# Patient Record
Sex: Male | Born: 1960 | Race: White | Hispanic: No | Marital: Married | Smoking: Never smoker
Health system: Southern US, Community
[De-identification: ages and names within clinical notes are randomized; demographics above are authoritative.]

## PROBLEM LIST (undated history)

## (undated) DIAGNOSIS — I10 Essential (primary) hypertension: Secondary | ICD-10-CM

## (undated) DIAGNOSIS — R42 Dizziness and giddiness: Secondary | ICD-10-CM

## (undated) DIAGNOSIS — Z8042 Family history of malignant neoplasm of prostate: Secondary | ICD-10-CM

## (undated) HISTORY — DX: Family history of malignant neoplasm of prostate: Z80.42

## (undated) HISTORY — DX: Dizziness and giddiness: R42

## (undated) HISTORY — PX: BIOPSY PROSTATE: PRO28

---

## 1996-09-01 HISTORY — PX: HERNIA REPAIR: SHX51

## 2019-05-11 ENCOUNTER — Encounter (HOSPITAL_COMMUNITY): Payer: Self-pay

## 2019-05-11 ENCOUNTER — Other Ambulatory Visit: Payer: Self-pay

## 2019-05-11 ENCOUNTER — Emergency Department (HOSPITAL_COMMUNITY)
Admission: EM | Admit: 2019-05-11 | Discharge: 2019-05-11 | Disposition: A | Discharge status: Home / Self Care | Payer: BLUE CROSS/BLUE SHIELD | Attending: Emergency Medicine | Admitting: Emergency Medicine

## 2019-05-11 DIAGNOSIS — H6123 Impacted cerumen, bilateral: Secondary | ICD-10-CM | POA: Insufficient documentation

## 2019-05-11 DIAGNOSIS — I10 Essential (primary) hypertension: Secondary | ICD-10-CM | POA: Diagnosis not present

## 2019-05-11 DIAGNOSIS — H81391 Other peripheral vertigo, right ear: Secondary | ICD-10-CM | POA: Diagnosis not present

## 2019-05-11 DIAGNOSIS — R42 Dizziness and giddiness: Secondary | ICD-10-CM | POA: Diagnosis not present

## 2019-05-11 HISTORY — DX: Essential (primary) hypertension: I10

## 2019-05-11 LAB — COMPREHENSIVE METABOLIC PANEL
ALT: 22 U/L (ref 0–44)
AST: 19 U/L (ref 15–41)
Albumin: 4.5 g/dL (ref 3.5–5.0)
Alkaline Phosphatase: 60 U/L (ref 38–126)
Anion gap: 8 (ref 5–15)
BUN: 15 mg/dL (ref 6–20)
CO2: 28 mmol/L (ref 22–32)
Calcium: 9.1 mg/dL (ref 8.9–10.3)
Chloride: 104 mmol/L (ref 98–111)
Creatinine, Ser: 1.02 mg/dL (ref 0.61–1.24)
GFR calc Af Amer: >60 mL/min (ref >60)
GFR calc non Af Amer: >60 mL/min (ref >60)
Glucose, Bld: 116 mg/dL — ABNORMAL HIGH (ref 70–99)
Potassium: 3.8 mmol/L (ref 3.5–5.1)
Sodium: 140 mmol/L (ref 135–145)
Total Bilirubin: 1 mg/dL (ref 0.3–1.2)
Total Protein: 7.4 g/dL (ref 6.5–8.1)

## 2019-05-11 LAB — CBC WITH DIFFERENTIAL/PLATELET
Abs Immature Granulocytes: 0.08 10*3/uL — ABNORMAL HIGH (ref 0.00–0.07)
Basophils Absolute: 0 10*3/uL (ref 0.0–0.1)
Basophils Relative: 1 %
Eosinophils Absolute: 0 10*3/uL (ref 0.0–0.5)
Eosinophils Relative: 0 %
HCT: 49.1 % (ref 39.0–52.0)
Hemoglobin: 16.4 g/dL (ref 13.0–17.0)
Immature Granulocytes: 1 %
Lymphocytes Relative: 12 %
Lymphs Abs: 1 10*3/uL (ref 0.7–4.0)
MCH: 30.8 pg (ref 26.0–34.0)
MCHC: 33.4 g/dL (ref 30.0–36.0)
MCV: 92.1 fL (ref 80.0–100.0)
Monocytes Absolute: 0.4 10*3/uL (ref 0.1–1.0)
Monocytes Relative: 4 %
Neutro Abs: 7 10*3/uL (ref 1.7–7.7)
Neutrophils Relative %: 82 %
Platelets: 194 10*3/uL (ref 150–400)
RBC: 5.33 MIL/uL (ref 4.22–5.81)
RDW: 13.2 % (ref 11.5–15.5)
WBC: 8.5 10*3/uL (ref 4.0–10.5)
nRBC: 0 % (ref 0.0–0.2)

## 2019-05-11 MED ORDER — DOCUSATE SODIUM 50 MG/5ML PO LIQD
25.0000 mg | Freq: Once | ORAL | Status: AC
  Administered 2019-05-11: 14:00:00 25 mg via OTIC
  Filled 2019-05-11: qty 10

## 2019-05-11 MED ORDER — MECLIZINE HCL 12.5 MG PO TABS
25.0000 mg | ORAL_TABLET | Freq: Once | ORAL | Status: AC
  Administered 2019-05-11: 25 mg via ORAL
  Filled 2019-05-11: qty 2

## 2019-05-11 MED ORDER — MECLIZINE HCL 25 MG PO TABS: 25.0000 mg | ORAL_TABLET | Freq: Three times a day (TID) | ORAL | 0 refills | Status: DC | PRN

## 2019-05-11 MED ORDER — SODIUM CHLORIDE 0.9 % IV BOLUS
1000.0000 mL | Freq: Once | INTRAVENOUS | Status: AC
  Administered 2019-05-11: 1000 mL via INTRAVENOUS

## 2019-05-11 NOTE — ED Notes (Signed)
Bilateral ears irrigated with warm water per Dr. Laverta Baltimore. Return of minimal particles with irrigation. Pt reports he can hear better out of the leg ear now, but no real change in right ear.

## 2019-05-11 NOTE — ED Provider Notes (Signed)
Emergency Department Provider Note   I have reviewed the triage vital signs and the nursing notes.   HISTORY  Chief Complaint Dizziness   HPI Patrick Lambert is a 58 y.o. male with PMH of HTN presents to the emergency department for evaluation of acute onset vertigo symptoms with decreased hearing in the right ear.  Symptoms began at 21:00 yesterday.  Patient states that he frequently has issues with earwax buildup but has never had symptoms like this in the past.  He describes feeling of room spinning with severe nausea and some vomiting at times.  Still and made worse with movements.  Patient had vomiting through the night.  His vertigo symptoms have improved to some degree this morning with symptoms mainly when moving his head back to midline after looking right. No CP or SOB. No unilateral weakness/numbness. No blurry or double vision. No sudden vision loss. Patient reports chronic tinnitus which is not worse from his baseline. No otalgia.   Past Medical History:  Diagnosis Date  . Hypertension     There are no active problems to display for this patient.   Past Surgical History:  Procedure Laterality Date  . HERNIA REPAIR  1998    Allergies Hornet venom, Shellfish allergy, Levaquin [levofloxacin], and Penicillins  No family history on file.  Social History Social History   Tobacco Use  . Smoking status: Never Smoker  . Smokeless tobacco: Never Used  Substance Use Topics  . Alcohol use: Never    Frequency: Never  . Drug use: Never    Review of Systems  Constitutional: No fever/chills Eyes: No visual changes. ENT: No sore throat. Decreased hearing on the right. Positive vertigo.  Cardiovascular: Denies chest pain. Respiratory: Denies shortness of breath. Gastrointestinal: No abdominal pain. Positive nausea and vomiting.  No diarrhea.  No constipation. Genitourinary: Negative for dysuria. Musculoskeletal: Negative for back pain. Skin: Negative for rash.  Neurological: Negative for headaches, focal weakness or numbness.  10-point ROS otherwise negative.  ____________________________________________   PHYSICAL EXAM:  VITAL SIGNS: ED Triage Vitals  Enc Vitals Group     BP 05/11/19 1208 (!) 184/118     Pulse Rate 05/11/19 1208 73     Resp 05/11/19 1208 18     Temp 05/11/19 1208 97.7 F (36.5 C)     Temp Source 05/11/19 1208 Oral     SpO2 05/11/19 1208 100 %     Weight 05/11/19 1207 206 lb (93.4 kg)     Height 05/11/19 1207 5\' 9"  (1.753 m)   Constitutional: Alert and oriented. Well appearing and in no acute distress. Eyes: Conjunctivae are normal. PERRL. EOMI. Slight horizontal nystagmus when looking right.  Head: Atraumatic. Nose: No congestion/rhinnorhea. Mouth/Throat: Mucous membranes are moist.  Oropharynx non-erythematous. Ears: Bilateral cerumen impaction noted. Otherwise normal external canals.  Neck: No stridor.  Cardiovascular: Normal rate, regular rhythm. Good peripheral circulation. Grossly normal heart sounds.   Respiratory: Normal respiratory effort.  No retractions. Lungs CTAB. Gastrointestinal: Soft and nontender. No distention.  Musculoskeletal: No lower extremity tenderness nor edema. No gross deformities of extremities. Neurologic:  Normal speech and language. No CN deficits 2-12. Normal upper and lower extremity strength/sensation. Normal finger-to-nose and heel-to-shin testing.  Skin:  Skin is warm, dry and intact. No rash noted.   ____________________________________________   LABS (all labs ordered are listed, but only abnormal results are displayed)  Labs Reviewed  COMPREHENSIVE METABOLIC PANEL - Abnormal; Notable for the following components:      Result Value  Glucose, Bld 116 (*)    All other components within normal limits  CBC WITH DIFFERENTIAL/PLATELET - Abnormal; Notable for the following components:   Abs Immature Granulocytes 0.08 (*)    All other components within normal limits     ____________________________________________   PROCEDURES  Procedure(s) performed:   Procedures  None ____________________________________________   INITIAL IMPRESSION / ASSESSMENT AND PLAN / ED COURSE  Pertinent labs & imaging results that were available during my care of the patient were reviewed by me and considered in my medical decision making (see chart for details).   Patient presents to the emergency department for evaluation of symptoms most consistent with peripheral vertigo.  He has bilateral cerumen impaction which does appear somewhat worse on the right.  He has no focal neurologic deficits.  Symptoms are worse with movement.  Plan for treatment of the cerumen impaction, screening blood work, IV fluids, meclizine. Very low suspicion clinically for central process.   Labs unremarkable. Irrigated ears after colace with mild improvement. Dizziness improved. Will refer to ENT for further evaluation and ear irrigation.  ____________________________________________  FINAL CLINICAL IMPRESSION(S) / ED DIAGNOSES  Final diagnoses:  Peripheral vertigo involving right ear  Bilateral impacted cerumen     MEDICATIONS GIVEN DURING THIS VISIT:  Medications  sodium chloride 0.9 % bolus 1,000 mL (0 mLs Intravenous Stopped 05/11/19 1415)  meclizine (ANTIVERT) tablet 25 mg (25 mg Oral Given 05/11/19 1335)  docusate (COLACE) 50 MG/5ML liquid 25 mg (25 mg Right EAR Given 05/11/19 1428)     NEW OUTPATIENT MEDICATIONS STARTED DURING THIS VISIT:  Discharge Medication List as of 05/11/2019  2:51 PM    START taking these medications   Details  meclizine (ANTIVERT) 25 MG tablet Take 1 tablet (25 mg total) by mouth 3 (three) times daily as needed for dizziness or nausea., Starting Wed 05/11/2019, Normal        Note:  This document was prepared using Dragon voice recognition software and may include unintentional dictation errors.  Alona BeneJoshua , MD Emergency Medicine    , Arlyss RepressJoshua G,  MD 05/11/19 [id number]

## 2019-05-11 NOTE — ED Triage Notes (Signed)
Pt started feeling dizzy at 2100. Was walking around and got worse. Dizziness worse with movement. If pt is focused and eyes are closed he denies any dizziness.

## 2019-05-11 NOTE — Discharge Instructions (Signed)
You were seen in the emergency department today with dizziness and vomiting.  Your test in the emergency department or largely normal.  I suspect that this is from a problem with your inner ear.  I have prescribed medication which should improve your symptoms.  We have irrigated your ears to try and remove the earwax buildup which may be contributing to your dizziness.  Please call the ENT doctor listed to schedule a follow-up appointment for further evaluation and potential rehab exercises.  You should return to the emergency department immediately if your symptoms suddenly worsen or if new symptoms develop such as vision change, numbness/weakness, or difficulty with speech/swallowing.

## 2019-05-12 ENCOUNTER — Telehealth: Payer: Self-pay | Admitting: Family Medicine

## 2019-05-12 NOTE — Telephone Encounter (Signed)
ER followup appointment scheduled 05/18/2019 with Patrick Lambert.

## 2019-05-17 ENCOUNTER — Other Ambulatory Visit: Payer: Self-pay

## 2019-05-18 ENCOUNTER — Ambulatory Visit: Payer: BLUE CROSS/BLUE SHIELD | Admitting: Family Medicine

## 2019-05-18 ENCOUNTER — Encounter: Payer: Self-pay | Admitting: Family Medicine

## 2019-05-18 VITALS — BP 164/96 | HR 67 | Temp 98.4°F | Resp 20 | Ht 69.0 in | Wt 215.4 lb

## 2019-05-18 DIAGNOSIS — R42 Dizziness and giddiness: Secondary | ICD-10-CM | POA: Diagnosis not present

## 2019-05-18 DIAGNOSIS — Z8042 Family history of malignant neoplasm of prostate: Secondary | ICD-10-CM | POA: Diagnosis not present

## 2019-05-18 DIAGNOSIS — I10 Essential (primary) hypertension: Secondary | ICD-10-CM

## 2019-05-18 DIAGNOSIS — H6123 Impacted cerumen, bilateral: Secondary | ICD-10-CM | POA: Diagnosis not present

## 2019-05-18 MED ORDER — METOPROLOL SUCCINATE ER 50 MG PO TB24: 50.0000 mg | ORAL_TABLET | Freq: Every day | ORAL | 0 refills | Status: DC

## 2019-05-18 MED ORDER — MECLIZINE HCL 25 MG PO TABS: 25.0000 mg | ORAL_TABLET | Freq: Three times a day (TID) | ORAL | 0 refills | Status: DC | PRN

## 2019-05-18 NOTE — Patient Instructions (Signed)
Use Debrox Wax Removal Kit over the counter.   Earwax Buildup, Adult The ears produce a substance called earwax that helps keep bacteria out of the ear and protects the skin in the ear canal. Occasionally, earwax can build up in the ear and cause discomfort or hearing loss. What increases the risk? This condition is more likely to develop in people who:  Are male.  Are elderly.  Naturally produce more earwax.  Clean their ears often with cotton swabs.  Use earplugs often.  Use in-ear headphones often.  Wear hearing aids.  Have narrow ear canals.  Have earwax that is overly thick or sticky.  Have eczema.  Are dehydrated.  Have excess hair in the ear canal. What are the signs or symptoms? Symptoms of this condition include:  Reduced or muffled hearing.  A feeling of fullness in the ear or feeling that the ear is plugged.  Fluid coming from the ear.  Ear pain.  Ear itch.  Ringing in the ear.  Coughing.  An obvious piece of earwax that can be seen inside the ear canal. How is this diagnosed? This condition may be diagnosed based on:  Your symptoms.  Your medical history.  An ear exam. During the exam, your health care provider will look into your ear with an instrument called an otoscope. You may have tests, including a hearing test. How is this treated? This condition may be treated by:  Using ear drops to soften the earwax.  Having the earwax removed by a health care provider. The health care provider may: ? Flush the ear with water. ? Use an instrument that has a loop on the end (curette). ? Use a suction device.  Surgery to remove the wax buildup. This may be done in severe cases. Follow these instructions at home:   Take over-the-counter and prescription medicines only as told by your health care provider.  Do not put any objects, including cotton swabs, into your ear. You can clean the opening of your ear canal with a washcloth or facial  tissue.  Follow instructions from your health care provider about cleaning your ears. Do not over-clean your ears.  Drink enough fluid to keep your urine clear or pale yellow. This will help to thin the earwax.  Keep all follow-up visits as told by your health care provider. If earwax builds up in your ears often or if you use hearing aids, consider seeing your health care provider for routine, preventive ear cleanings. Ask your health care provider how often you should schedule your cleanings.  If you have hearing aids, clean them according to instructions from the manufacturer and your health care provider. Contact a health care provider if:  You have ear pain.  You develop a fever.  You have blood, pus, or other fluid coming from your ear.  You have hearing loss.  You have ringing in your ears that does not go away.  Your symptoms do not improve with treatment.  You feel like the room is spinning (vertigo). Summary  Earwax can build up in the ear and cause discomfort or hearing loss.  The most common symptoms of this condition include reduced or muffled hearing and a feeling of fullness in the ear or feeling that the ear is plugged.  This condition may be diagnosed based on your symptoms, your medical history, and an ear exam.  This condition may be treated by using ear drops to soften the earwax or by having the earwax removed  by a health care provider.  Do not put any objects, including cotton swabs, into your ear. You can clean the opening of your ear canal with a washcloth or facial tissue. This information is not intended to replace advice given to you by your health care provider. Make sure you discuss any questions you have with your health care provider. Document Released: 09/25/2004 Document Revised: 07/31/2017 Document Reviewed: 10/29/2016 Elsevier Patient Education  [place].   Vertigo Vertigo is the feeling that you or the things around you are moving  when they are not. This feeling can come and go at any time. Vertigo often goes away on its own. This condition can be dangerous if it happens when you are doing activities like driving or working with machines. Your doctor will do tests to find the cause of your vertigo. These tests will also help your doctor decide on the best treatment for you. Follow these instructions at home: Eating and drinking      Drink enough fluid to keep your pee (urine) pale yellow.  Do not drink alcohol. Activity  Return to your normal activities as told by your doctor. Ask your doctor what activities are safe for you.  In the morning, first sit up on the side of the bed. When you feel okay, stand slowly while you hold onto something until you know that your balance is fine.  Move slowly. Avoid sudden body or head movements or certain positions, as told by your doctor.  Use a cane if you have trouble standing or walking.  Sit down right away if you feel dizzy.  Avoid doing any tasks or activities that can cause danger to you or others if you get dizzy.  Avoid bending down if you feel dizzy. Place items in your home so that they are easy for you to reach without leaning over.  Do not drive or use heavy machinery if you feel dizzy. General instructions  Take over-the-counter and prescription medicines only as told by your doctor.  Keep all follow-up visits as told by your doctor. This is important. Contact a doctor if:  Your medicine does not help your vertigo.  You have a fever.  Your problems get worse or you have new symptoms.  Your family or friends see changes in your behavior.  The feeling of being sick to your stomach gets worse.  Your vomiting gets worse.  You lose feeling (have numbness) in part of your body.  You feel prickling and tingling in a part of your body. Get help right away if:  You have trouble moving or talking.  You are always dizzy.  You pass out (faint).   You get very bad headaches.  You feel weak in your hands, arms, or legs.  You have changes in your hearing.  You have changes in how you see (vision).  You get a stiff neck.  Bright light starts to bother you. Summary  Vertigo is the feeling that you or the things around you are moving when they are not.  Your doctor will do tests to find the cause of your vertigo.  You may be told to avoid some tasks, positions, or movements.  Contact a doctor if your medicine is not helping, or if you have a fever, new symptoms, or a change in behavior.  Get help right away if you get very bad headaches, or if you have changes in how you speak, hear, or see. This information is not intended to  replace advice given to you by your health care provider. Make sure you discuss any questions you have with your health care provider. Document Released: 05/27/2008 Document Revised: 07/12/2018 Document Reviewed: 07/12/2018 Elsevier Patient Education  2020 [employer].

## 2019-05-18 NOTE — Progress Notes (Signed)
Assessment & Plan:  1. Vertigo - Resolved at this time. Patient would like referral to ENT. Education provided on vertigo.  - Ambulatory referral to ENT - meclizine (ANTIVERT) 25 MG tablet; Take 1 tablet (25 mg total) by mouth 3 (three) times daily as needed for dizziness or nausea.  Dispense: 30 tablet; Refill: 0  2. Excessive cerumen in ear canal, bilateral - Encouraged to use Debrox Wax Removal Kit OTC. Education provided on excessive ear wax.   3. Essential hypertension - Elevated today. Patient reports he does take his BP at home. States most of the time it is normal, sometimes it is elevated, and sometimes the machine doesn't work. Encouraged to continue doing so and keep a log to bring with him to his next appointment with Dr. Lajuana Ripple.  - metoprolol succinate (TOPROL-XL) 50 MG 24 hr tablet; Take 1 tablet (50 mg total) by mouth daily.  Dispense: 90 tablet; Refill: 0  4. Family history of prostate cancer - Patient reports he started seeing a urologist due to his father having prostate cancer. His PSA has been creeping up over the years but states it is in the 3s most recently. He has had a prostate biopsy completed in the past which was negative.    Follow up plan: Return as scheduled.  Hendricks Limes, MSN, APRN, FNP-C [facility]  Subjective:   Patient ID: Patrick Lambert, male    DOB: [birth year], 58 y.o.   MRN: [id number]  HPI: Patrick Lambert is a 58 y.o. male presenting on 05/18/2019 for Follow-up (emergency visit for vertigo)  Patrick Lambert is a new patient to our office as he recently moved from [place].   Patient was seen at [place] on 05/11/2019 due to vertigo with nausea and vomiting. He was prescribed meclizine 25 mg TID PRN which is very effective. Last dose taken was Saturday morning (4 days ago). He has not experienced any vertigo since that time. Denies any previous symptoms of vertigo. He reports they did try twice to irrigate both ears  when he was in the ER. He does have a history of impacted cerumen and typically flushes his ears himself but has not been doing so since his move within the past year.    ROS: Negative unless specifically indicated above in HPI.   Relevant past medical history reviewed and updated as indicated.   Allergies and medications reviewed and updated.   Current Outpatient Medications:  .  metoprolol succinate (TOPROL-XL) 50 MG 24 hr tablet, Take 1 tablet (50 mg total) by mouth daily., Disp: 90 tablet, Rfl: 0 .  meclizine (ANTIVERT) 25 MG tablet, Take 1 tablet (25 mg total) by mouth 3 (three) times daily as needed for dizziness or nausea., Disp: 30 tablet, Rfl: 0  Allergies  Allergen Reactions  . Bee Venom Anaphylaxis  . Penicillins Hives, Shortness Of Breath and Itching    Did it involve swelling of the face/tongue/throat, SOB, or low BP? No Did it involve sudden or severe rash/hives, skin peeling, or any reaction on the inside of your mouth or nose? Yes Did you need to seek medical attention at a hospital or doctor's office? No When did it last happen?Unknown If all above answers are "NO", may proceed with cephalosporin use.   . Shellfish Allergy Anaphylaxis    Patient states it is all seafood.  . Levaquin [Levofloxacin] Hives    Objective:   BP (!) 164/96 Comment: manual  Pulse 67   Temp 98.4 F (  36.9 C) (Temporal)   Resp 20   Ht 5' 9"  (1.753 m)   Wt 215 lb 6.4 oz (97.7 kg)   SpO2 97%   BMI 31.81 kg/m    Physical Exam Vitals signs reviewed.  Constitutional:      General: He is not in acute distress.    Appearance: Normal appearance. He is obese. He is not ill-appearing, toxic-appearing or diaphoretic.  HENT:     Head: Normocephalic and atraumatic.     Right Ear: Ear canal and external ear normal. There is impacted cerumen.     Left Ear: Ear canal and external ear normal. There is impacted cerumen.  Eyes:     General: No scleral icterus.       Right eye: No  discharge.        Left eye: No discharge.     Extraocular Movements: Extraocular movements intact.     Conjunctiva/sclera: Conjunctivae normal.     Pupils: Pupils are equal, round, and reactive to light.  Neck:     Musculoskeletal: Normal range of motion and neck supple.  Cardiovascular:     Rate and Rhythm: Normal rate.  Pulmonary:     Effort: Pulmonary effort is normal. No respiratory distress.  Musculoskeletal: Normal range of motion.  Skin:    General: Skin is warm and dry.     Capillary Refill: Capillary refill takes less than 2 seconds.  Neurological:     General: No focal deficit present.     Mental Status: He is alert and oriented to person, place, and time. Mental status is at baseline.  Psychiatric:        Mood and Affect: Mood normal.        Behavior: Behavior normal.        Thought Content: Thought content normal.        Judgment: Judgment normal.

## 2019-05-24 ENCOUNTER — Ambulatory Visit: Payer: Self-pay | Admitting: Family Medicine

## 2019-06-16 ENCOUNTER — Ambulatory Visit (INDEPENDENT_AMBULATORY_CARE_PROVIDER_SITE_OTHER): Payer: BLUE CROSS/BLUE SHIELD | Admitting: Otolaryngology

## 2019-06-16 ENCOUNTER — Other Ambulatory Visit: Payer: Self-pay

## 2019-06-16 DIAGNOSIS — R42 Dizziness and giddiness: Secondary | ICD-10-CM | POA: Diagnosis not present

## 2019-06-16 DIAGNOSIS — H903 Sensorineural hearing loss, bilateral: Secondary | ICD-10-CM | POA: Diagnosis not present

## 2019-06-16 DIAGNOSIS — H6123 Impacted cerumen, bilateral: Secondary | ICD-10-CM

## 2019-07-11 ENCOUNTER — Other Ambulatory Visit: Payer: Self-pay

## 2019-07-12 ENCOUNTER — Encounter: Payer: Self-pay | Admitting: Family Medicine

## 2019-07-12 ENCOUNTER — Ambulatory Visit: Payer: BLUE CROSS/BLUE SHIELD | Admitting: Family Medicine

## 2019-07-12 VITALS — BP 155/99 | HR 71 | Temp 99.0°F | Resp 20 | Ht 69.0 in | Wt 217.0 lb

## 2019-07-12 DIAGNOSIS — R42 Dizziness and giddiness: Secondary | ICD-10-CM | POA: Diagnosis not present

## 2019-07-12 DIAGNOSIS — I10 Essential (primary) hypertension: Secondary | ICD-10-CM | POA: Diagnosis not present

## 2019-07-12 MED ORDER — AMLODIPINE BESYLATE 5 MG PO TABS: 5.0000 mg | ORAL_TABLET | Freq: Every day | ORAL | 3 refills | Status: DC

## 2019-07-12 NOTE — Progress Notes (Signed)
Subjective: BP:ZWCHENIDP care, hypertension HPI: Patrick Lambert is a 58 y.o. male presenting to clinic today for:  1.  Hypertension Patient here for 1 month follow-up on hypertension.  He reports compliance with Toprol-XL 50mg  daily.  Unfortunately has not been able to monitor blood pressures very closely at home as they seemingly fluctuate.  He either has very high blood pressure or has very little blood pressure.  He notes that when he was seeing his previous PCP he did have blood pressures that tended to run a little on the higher side.  No chest pain, shortness of breath, dizziness, lower extremity edema.  He is a non-smoker.  He is very physically active.  2.  Vertigo Patient notes that he saw Dr. Benjamine Mola for the vertigo.  This is determined to be a viral process.  He is no longer using meclizine.  Denies any recurrent dizziness or vertigo symptoms.  Past Medical History:  Diagnosis Date  . Family history of prostate cancer    Father  . Hypertension   . Vertigo    Past Surgical History:  Procedure Laterality Date  . BIOPSY PROSTATE     Negative  . HERNIA REPAIR  1998   Social History   Socioeconomic History  . Marital status: Married    Spouse name: Hassan Rowan   . Number of children: 1  . Years of education: Not on file  . Highest education level: Not on file  Occupational History  . Occupation: retired   Scientific laboratory technician  . Financial resource strain: Not on file  . Food insecurity    Worry: Not on file    Inability: Not on file  . Transportation needs    Medical: Not on file    Non-medical: Not on file  Tobacco Use  . Smoking status: Never Smoker  . Smokeless tobacco: Never Used  Substance and Sexual Activity  . Alcohol use: Never    Frequency: Never  . Drug use: Never  . Sexual activity: Not on file  Lifestyle  . Physical activity    Days per week: Not on file    Minutes per session: Not on file  . Stress: Not on file  Relationships  . Social Clinical research associate on phone: Not on file    Gets together: Not on file    Attends religious service: Not on file    Active member of club or organization: Not on file    Attends meetings of clubs or organizations: Not on file    Relationship status: Not on file  . Intimate partner violence    Fear of current or ex partner: Not on file    Emotionally abused: Not on file    Physically abused: Not on file    Forced sexual activity: Not on file  Other Topics Concern  . Not on file  Social History Narrative   Tactical/ Scientist, research (medical). Previously a Curator.   Current Meds  Medication Sig  . metoprolol succinate (TOPROL-XL) 50 MG 24 hr tablet Take 1 tablet (50 mg total) by mouth daily.  . [DISCONTINUED] meclizine (ANTIVERT) 25 MG tablet Take 1 tablet (25 mg total) by mouth 3 (three) times daily as needed for dizziness or nausea.   Family History  Problem Relation Age of Onset  . Breast cancer Mother   . Prostate cancer Father   . Cancer Maternal Grandfather    Allergies  Allergen Reactions  . Bee Venom Anaphylaxis  . Penicillins Hives, Shortness Of Breath  and Itching    Did it involve swelling of the face/tongue/throat, SOB, or low BP? No Did it involve sudden or severe rash/hives, skin peeling, or any reaction on the inside of your mouth or nose? Yes Did you need to seek medical attention at a hospital or doctor's office? No When did it last happen?Unknown If all above answers are "NO", may proceed with cephalosporin use.   . Shellfish Allergy Anaphylaxis    Patient states it is all seafood.  Barbera Setters [Levofloxacin] Hives     Health Maintenance: Done 5 years ago in [place].  Release of information form completed. ROS: Per HPI  Objective: Office vital signs reviewed. BP (!) 155/99   Pulse 71   Temp 99 F (37.2 C)   Resp 20   Ht 5\' 9"  (1.753 m)   Wt 217 lb (98.4 kg)   SpO2 98%   BMI 32.05 kg/m   Physical Examination:  General: Awake, alert, well  nourished, No acute distress HEENT: Normal, sclera white, MMM Cardio: regular rate and rhythm, S1S2 heard, no murmurs appreciated Pulm: clear to auscultation bilaterally, no wheezes, rhonchi or rales; normal work of breathing on room air Extremities: warm, well perfused, No edema, cyanosis or clubbing; +2 pulses bilaterally  Assessment/ Plan: 58 y.o. male   1. Essential hypertension Not controlled.  I added amlodipine 5 mg daily.  He is to monitor his blood pressures daily.  We will reconvene in about 2 weeks via telephone to review his blood pressure log.  May need to increase to 10 mg at that time. - amLODipine (NORVASC) 5 MG tablet; Take 1 tablet (5 mg total) by mouth daily.  Dispense: 90 tablet; Refill: 3  2. Vertigo Resolved.   41, DO [facility] [phone]

## 2019-07-12 NOTE — Patient Instructions (Signed)
How to Take Your Blood Pressure You can take your blood pressure at home with a machine. You may need to check your blood pressure at home:  To check if you have high blood pressure (hypertension).  To check your blood pressure over time.  To make sure your blood pressure medicine is working. Supplies needed: You will need a blood pressure machine, or monitor. You can buy one at a drugstore or online. When choosing one:  Choose one with an arm cuff.  Choose one that wraps around your upper arm. Only one finger should fit between your arm and the cuff.  Do not choose one that measures your blood pressure from your wrist or finger. Your doctor can suggest a monitor. How to prepare Avoid these things for 30 minutes before checking your blood pressure:  Drinking caffeine.  Drinking alcohol.  Eating.  Smoking.  Exercising. Five minutes before checking your blood pressure:  Pee.  Sit in a dining chair. Avoid sitting in a soft couch or armchair.  Be quiet. Do not talk. How to take your blood pressure Follow the instructions that came with your machine. If you have a digital blood pressure monitor, these may be the instructions: 1. Sit up straight. 2. Place your feet on the floor. Do not cross your ankles or legs. 3. Rest your left arm at the level of your heart. You may rest it on a table, desk, or chair. 4. Pull up your shirt sleeve. 5. Wrap the blood pressure cuff around the upper part of your left arm. The cuff should be 1 inch (2.5 cm) above your elbow. It is best to wrap the cuff around bare skin. 6. Fit the cuff snugly around your arm. You should be able to place only one finger between the cuff and your arm. 7. Put the cord inside the groove of your elbow. 8. Press the power button. 9. Sit quietly while the cuff fills with air and loses air. 10. Write down the numbers on the screen. 11. Wait 2-3 minutes and then repeat steps 1-10. What do the numbers mean? Two  numbers make up your blood pressure. The first number is called systolic pressure. The second is called diastolic pressure. An example of a blood pressure reading is "120 over 80" (or 120/80). If you are an adult and do not have a medical condition, use this guide to find out if your blood pressure is normal: Normal  First number: below 120.  Second number: below 80. Elevated  First number: 120-129.  Second number: below 80. Hypertension stage 1  First number: 130-139.  Second number: 80-89. Hypertension stage 2  First number: 140 or above.  Second number: 90 or above. Your blood pressure is above normal even if only the top or bottom number is above normal. Follow these instructions at home:  Check your blood pressure as often as your doctor tells you to.  Take your monitor to your next doctor's appointment. Your doctor will: ? Make sure you are using it correctly. ? Make sure it is working right.  Make sure you understand what your blood pressure numbers should be.  Tell your doctor if your medicines are causing side effects. Contact a doctor if:  Your blood pressure keeps being high. Get help right away if:  Your first blood pressure number is higher than 180.  Your second blood pressure number is higher than 120. This information is not intended to replace advice given to you by your health   care provider. Make sure you discuss any questions you have with your health care provider. Document Released: 07/31/2008 Document Revised: 07/31/2017 Document Reviewed: 01/25/2016 Elsevier Patient Education  2020 Elsevier Inc.  

## 2019-09-22 ENCOUNTER — Other Ambulatory Visit: Payer: Self-pay

## 2019-09-23 ENCOUNTER — Encounter: Payer: Self-pay | Admitting: Family Medicine

## 2019-09-23 ENCOUNTER — Ambulatory Visit: Payer: BLUE CROSS/BLUE SHIELD | Admitting: Family Medicine

## 2019-09-23 VITALS — BP 121/89 | HR 78 | Temp 98.4°F | Ht 69.0 in | Wt 216.0 lb

## 2019-09-23 DIAGNOSIS — I10 Essential (primary) hypertension: Secondary | ICD-10-CM | POA: Diagnosis not present

## 2019-09-23 NOTE — Progress Notes (Signed)
Subjective: CC: follow up hypertension HPI: Patrick Lambert is a 59 y.o. male presenting to clinic today for:  1.  Hypertension Compliant with Norvasc 5 mg daily.  The Norvasc was added at his last visit in November.  He notes that he has been doing well on this medication.  His blood pressures at home have varied anywhere between 130 and 140 systolic over 80s to 90s diastolic.  He does note he is using a manual manometer by himself and this may be impacting his blood pressures.  No chest pain, shortness of breath, headaches, lower extremity edema.  He had one instance when he was at a training session where he felt like his hands were a little bit swollen but this resolved after holding them up.  He also notes that he has had occasional abnormal sensation in the morning when he takes the medicine but this resolves after being seated for a few minutes.  Denies overt dizziness or lightheadedness.  Past Medical History:  Diagnosis Date  . Family history of prostate cancer    Father  . Hypertension   . Vertigo    Social History   Socioeconomic History  . Marital status: Married    Spouse name: Steward Drone   . Number of children: 1  . Years of education: Not on file  . Highest education level: Not on file  Occupational History  . Occupation: retired   Tobacco Use  . Smoking status: Never Smoker  . Smokeless tobacco: Never Used  Substance and Sexual Activity  . Alcohol use: Never  . Drug use: Never  . Sexual activity: Not on file  Other Topics Concern  . 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   Social Determinants of Health   Financial Resource Strain:   . Difficulty of Paying Living Expenses: Not on file  Food Insecurity:   . Worried About Programme researcher, broadcasting/film/video in the Last Year: Not on file  . Ran Out of Food in the Last Year: Not on file  Transportation Needs:   . Lack of Transportation (Medical): Not on file  .  Lack of Transportation (Non-Medical): Not on file  Physical Activity:   . Days of Exercise per Week: Not on file  . Minutes of Exercise per Session: Not on file  Stress:   . Feeling of Stress : Not on file  Social Connections:   . Frequency of Communication with Friends and Family: Not on file  . Frequency of Social Gatherings with Friends and Family: Not on file  . Attends Religious Services: Not on file  . Active Member of Clubs or Organizations: Not on file  . Attends Banker Meetings: Not on file  . Marital Status: Not on file  Intimate Partner Violence:   . Fear of Current or Ex-Partner: Not on file  . Emotionally Abused: Not on file  . Physically Abused: Not on file  . Sexually Abused: Not on file   Current Meds  Medication Sig  . amLODipine (NORVASC) 5 MG tablet Take 1 tablet (5 mg total) by mouth daily.   Family History  Problem Relation Age of Onset  . Breast cancer Mother   . Prostate cancer Father   . Cancer Maternal Grandfather        prostate    Allergies  Allergen Reactions  . Bee Venom Anaphylaxis  . Penicillins Hives, Shortness Of Breath and Itching    Did it  involve swelling of the face/tongue/throat, SOB, or low BP? No Did it involve sudden or severe rash/hives, skin peeling, or any reaction on the inside of your mouth or nose? Yes Did you need to seek medical attention at a hospital or doctor's office? No When did it last happen?Unknown If all above answers are "NO", may proceed with cephalosporin use.   . Shellfish Allergy Anaphylaxis    Patient states it is all seafood.  Mack Hook [Levofloxacin] Hives     Health Maintenance: Done 5 years ago in [place].  Release of information form completed.  ROS: Per HPI  Objective: Office vital signs reviewed. BP 121/89   Pulse 78   Temp 98.4 F (36.9 C) (Temporal)   Ht 5\' 9"  (1.753 m)   Wt 216 lb (98 kg)   SpO2 98%   BMI 31.90 kg/m   Physical Examination:  General: Awake,  alert, well nourished, No acute distress HEENT: Normal, sclera white, MMM Cardio: regular rate and rhythm, S1S2 heard, no murmurs appreciated Pulm: clear to auscultation bilaterally, no wheezes, rhonchi or rales; normal work of breathing on room air Extremities: warm, well perfused, No edema, cyanosis or clubbing; +2 pulses bilaterally  Assessment/ Plan: 59 y.o. male   1. Essential hypertension Controlled.  Continue current regimen.  He can follow-up in 6 months, sooner if needed.    Janora Norlander, DO [place] [phone]

## 2019-10-07 ENCOUNTER — Ambulatory Visit: Payer: BLUE CROSS/BLUE SHIELD | Admitting: Urology

## 2019-10-07 ENCOUNTER — Other Ambulatory Visit: Payer: Self-pay

## 2019-10-07 ENCOUNTER — Encounter: Payer: Self-pay | Admitting: Urology

## 2019-10-07 ENCOUNTER — Other Ambulatory Visit: Payer: Self-pay | Admitting: Urology

## 2019-10-07 VITALS — BP 157/85 | HR 80 | Temp 97.9°F | Ht 69.0 in | Wt 209.0 lb

## 2019-10-07 DIAGNOSIS — N400 Enlarged prostate without lower urinary tract symptoms: Secondary | ICD-10-CM

## 2019-10-07 DIAGNOSIS — R972 Elevated prostate specific antigen [PSA]: Secondary | ICD-10-CM | POA: Diagnosis not present

## 2019-10-07 LAB — POCT URINALYSIS DIPSTICK
Bilirubin, UA: NEGATIVE
Blood, UA: NEGATIVE
Glucose, UA: NEGATIVE
Ketones, UA: NEGATIVE
Leukocytes, UA: NEGATIVE
Nitrite, UA: NEGATIVE
Protein, UA: NEGATIVE
Spec Grav, UA: 1.025 (ref 1.010–1.025)
Urobilinogen, UA: 0.2 E.U./dL
pH, UA: 5 (ref 5.0–8.0)

## 2019-10-07 NOTE — Progress Notes (Signed)
Subjective: 1. BPH without urinary obstruction   2. Elevated PSA      Mr. Patrick Lambert is a 59 yo male who has a history of BPH with a prior prostate biopsy for an elevated PSA 2 years ago.  The biopsy was negative.  He had a PSA last year that was 3.2 and has been slowly rising since age 80.   His IPSS is 2.  He has minimal nocturia.  He has a family history of prostate cancer in his father and he had prostatitis at 59 but no other GU history.  He has no sexual dysfunction.    ROS:  Review of Systems  All other systems reviewed and are negative.   Allergies  Allergen Reactions  . Bee Venom Anaphylaxis  . Penicillins Hives, Shortness Of Breath and Itching    Did it involve swelling of the face/tongue/throat, SOB, or low BP? No Did it involve sudden or severe rash/hives, skin peeling, or any reaction on the inside of your mouth or nose? Yes Did you need to seek medical attention at a hospital or doctor's office? No When did it last happen?Unknown If all above answers are "NO", may proceed with cephalosporin use.   . Shellfish Allergy Anaphylaxis    Patient states it is all seafood.  Mack Hook [Levofloxacin] Hives    Past Medical History:  Diagnosis Date  . Family history of prostate cancer    Father  . Hypertension   . Vertigo     Past Surgical History:  Procedure Laterality Date  . BIOPSY PROSTATE     Negative  . HERNIA REPAIR  1998    Social History   Socioeconomic History  . Marital status: Married    Spouse name: Hassan Rowan   . Number of children: 1  . Years of education: Not on file  . Highest education level: Not on file  Occupational History  . Occupation: retired   Tobacco Use  . Smoking status: Never Smoker  . Smokeless tobacco: Never Used  Substance and Sexual Activity  . Alcohol use: Never  . Drug use: Never  . Sexual activity: Not on file  Other Topics Concern  . Not on file  Social History Narrative   Tactical/ Scientist, research (medical).  Previously a Curator.   Social Determinants of Health   Financial Resource Strain:   . Difficulty of Paying Living Expenses: Not on file  Food Insecurity:   . Worried About Charity fundraiser in the Last Year: Not on file  . Ran Out of Food in the Last Year: Not on file  Transportation Needs:   . Lack of Transportation (Medical): Not on file  . Lack of Transportation (Non-Medical): Not on file  Physical Activity:   . Days of Exercise per Week: Not on file  . Minutes of Exercise per Session: Not on file  Stress:   . Feeling of Stress : Not on file  Social Connections:   . Frequency of Communication with Friends and Family: Not on file  . Frequency of Social Gatherings with Friends and Family: Not on file  . Attends Religious Services: Not on file  . Active Member of Clubs or Organizations: Not on file  . Attends Archivist Meetings: Not on file  . Marital Status: Not on file  Intimate Partner Violence:   . Fear of Current or Ex-Partner: Not on file  . Emotionally Abused: Not on file  . Physically Abused: Not on file  . Sexually Abused:  Not on file    Family History  Problem Relation Age of Onset  . Breast cancer Mother   . Lung cancer Mother   . Prostate cancer Father   . Stomach cancer Father   . Cancer Maternal Grandfather        prostate     Anti-infectives: Anti-infectives (From admission, onward)   None      Current Outpatient Medications  Medication Sig Dispense Refill  . amLODipine (NORVASC) 5 MG tablet Take 1 tablet (5 mg total) by mouth daily. 90 tablet 3   No current facility-administered medications for this visit.     Objective: BP (!) 157/85   Pulse 80   Temp 97.9 F (36.6 C)   Ht 5\' 9"  (1.753 m)   Wt 209 lb (94.8 kg)   BMI 30.86 kg/m    Physical Exam Vitals reviewed.  Constitutional:      Appearance: Normal appearance.  Genitourinary:    Comments: NST without rectal mass. Prostate 3+ with some asymmetry left >  right but benign consistency.  SV non-palpable. Neurological:     Mental Status: He is alert.     Lab Results:  Results for orders placed or performed in visit on 10/07/19 (from the past 24 hour(s))  POCT urinalysis dipstick     Status: None   Collection Time: 10/07/19  1:34 PM  Result Value Ref Range   Color, UA yellow    Clarity, UA     Glucose, UA Negative Negative   Bilirubin, UA neg    Ketones, UA neg    Spec Grav, UA 1.025 1.010 - 1.025   Blood, UA neg    pH, UA 5.0 5.0 - 8.0   Protein, UA Negative Negative   Urobilinogen, UA 0.2 0.2 or 1.0 E.U./dL   Nitrite, UA neg    Leukocytes, UA Negative Negative   Appearance clear    Odor      BMET No results for input(s): NA, K, CL, CO2, GLUCOSE, BUN, CREATININE, CALCIUM in the last 72 hours. PT/INR No results for input(s): LABPROT, INR in the last 72 hours. ABG No results for input(s): PHART, HCO3 in the last 72 hours.  Invalid input(s): PCO2, PO2  Studies/Results: No results found.   Assessment/Plan: BPH with an elevated PSA with prior negative biopsy.   I will get a PSA today and request records from his prior Urologist in 12/05/19.  If the PSA has remained stable, he will return in 1 year with a PSA for an exam.  If the PSA is up significantly, he will need an MRIP and consideration of a repeat biopsy.   No orders of the defined types were placed in this encounter.    Orders Placed This Encounter  Procedures  . PSA, total and free    Standing Status:   Future    Standing Expiration Date:   11/04/2019  . PSA, total and free    Standing Status:   Future    Standing Expiration Date:   04/05/2021  . POCT urinalysis dipstick     Return in about 1 year (around 10/06/2020) for elevated PSA.    CC: Dr. 12/04/2020.      Doylene Canard 10/07/2019 [phone]

## 2019-10-10 LAB — PSA, TOTAL AND FREE
PSA, % Free: 24 % (calc) — ABNORMAL LOW (ref >25)
PSA, Free: 1.1 ng/mL
PSA, Total: 4.6 ng/mL — ABNORMAL HIGH (ref <=4.0)

## 2019-10-11 ENCOUNTER — Other Ambulatory Visit: Payer: Self-pay | Admitting: Urology

## 2019-10-11 ENCOUNTER — Telehealth: Payer: Self-pay

## 2019-10-11 DIAGNOSIS — R972 Elevated prostate specific antigen [PSA]: Secondary | ICD-10-CM

## 2019-10-11 NOTE — Progress Notes (Signed)
His PSA is up to 4.6 from 3.2 last year.  I have placed an order for an MRI of the prostate and a Cr. Level.  He will need f/u with the results.

## 2019-10-11 NOTE — Telephone Encounter (Signed)
-----   Message from Bjorn Pippin, MD sent at 10/11/2019  7:57 AM EST ----- His PSA is up to 4.6 from 3.2 last year.  I have placed an order for an MRI of the prostate and a Cr. Level.  He will need f/u with the results.

## 2019-10-11 NOTE — Telephone Encounter (Signed)
Pt notified of results- appt scheduled for 2/26 pending MRI results. Pt will notify scheduling of f/u appt with MD

## 2019-10-21 ENCOUNTER — Other Ambulatory Visit: Payer: Self-pay | Admitting: Urology

## 2019-10-21 DIAGNOSIS — R972 Elevated prostate specific antigen [PSA]: Secondary | ICD-10-CM

## 2019-10-26 ENCOUNTER — Other Ambulatory Visit: Payer: BLUE CROSS/BLUE SHIELD

## 2019-10-28 ENCOUNTER — Ambulatory Visit: Payer: BLUE CROSS/BLUE SHIELD | Admitting: Urology

## 2019-11-03 DIAGNOSIS — D225 Melanocytic nevi of trunk: Secondary | ICD-10-CM | POA: Diagnosis not present

## 2019-11-03 DIAGNOSIS — Z1283 Encounter for screening for malignant neoplasm of skin: Secondary | ICD-10-CM | POA: Diagnosis not present

## 2019-11-12 ENCOUNTER — Other Ambulatory Visit: Payer: Self-pay | Admitting: Urology

## 2019-11-12 ENCOUNTER — Ambulatory Visit
Admission: RE | Admit: 2019-11-12 | Discharge: 2019-11-12 | Disposition: A | Discharge status: Home / Self Care | Payer: BLUE CROSS/BLUE SHIELD | Source: Ambulatory Visit | Attending: Urology | Admitting: Urology

## 2019-11-12 ENCOUNTER — Other Ambulatory Visit: Payer: Self-pay

## 2019-11-12 DIAGNOSIS — R972 Elevated prostate specific antigen [PSA]: Secondary | ICD-10-CM

## 2019-12-07 ENCOUNTER — Ambulatory Visit: Payer: BLUE CROSS/BLUE SHIELD | Admitting: Urology

## 2019-12-07 ENCOUNTER — Other Ambulatory Visit: Payer: Self-pay

## 2019-12-07 ENCOUNTER — Encounter: Payer: Self-pay | Admitting: Urology

## 2019-12-07 VITALS — BP 146/94 | HR 92 | Temp 98.1°F | Ht 69.0 in | Wt 210.0 lb

## 2019-12-07 DIAGNOSIS — R972 Elevated prostate specific antigen [PSA]: Secondary | ICD-10-CM | POA: Diagnosis not present

## 2019-12-07 NOTE — Patient Instructions (Signed)

## 2019-12-07 NOTE — Progress Notes (Signed)

## 2019-12-07 NOTE — Progress Notes (Signed)
12/07/2019 1:32 PM   Patrick Lambert [id number] [id number]  Referring provider: Raliegh Ip, DO [address],  [place] [address]  Elevated PSA  HPI: Mr Patrick Lambert is a 59yo here for followup for elevated PSA. He had previous biopsy 2 years ago which was negative. PSA was 4.6 with 24% free on 10/07/2019. Prostate MRI showed no concerning lesions. No significant LUTS   PMH: Past Medical History:  Diagnosis Date  . Family history of prostate cancer    Father  . Hypertension   . Vertigo     Surgical History: Past Surgical History:  Procedure Laterality Date  . BIOPSY PROSTATE     Negative  . HERNIA REPAIR  1998    Home Medications:  Allergies as of 12/07/2019      Reactions   Bee Venom Anaphylaxis   Penicillins Hives, Shortness Of Breath, Itching   Did it involve swelling of the face/tongue/throat, SOB, or low BP? No Did it involve sudden or severe rash/hives, skin peeling, or any reaction on the inside of your mouth or nose? Yes Did you need to seek medical attention at a hospital or doctor's office? No When did it last happen?Unknown If all above answers are "NO", may proceed with cephalosporin use.   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   Levaquin [levofloxacin] Hives      Medication List       Accurate as of December 07, 2019  1:32 PM. If you have any questions, ask your nurse or doctor.        amLODipine 5 MG tablet Commonly known as: NORVASC Take 1 tablet (5 mg total) by mouth daily.       Allergies:  Allergies  Allergen Reactions  . Bee Venom Anaphylaxis  . Penicillins Hives, Shortness Of Breath and Itching    Did it involve swelling of the face/tongue/throat, SOB, or low BP? No Did it involve sudden or severe rash/hives, skin peeling, or any reaction on the inside of your mouth or nose? Yes Did you need to seek medical attention at a hospital or doctor's office? No When did it last happen?Unknown If  all above answers are "NO", may proceed with cephalosporin use.   . Shellfish Allergy Anaphylaxis    Patient states it is all seafood.  Barbera Setters [Levofloxacin] Hives    Family History: Family History  Problem Relation Age of Onset  . Breast cancer Mother   . Lung cancer Mother   . Prostate cancer Father   . Stomach cancer Father   . Cancer Maternal Grandfather        prostate     Social History:  reports that he has never smoked. He has never used smokeless tobacco. He reports that he does not drink alcohol or use drugs.  ROS: All other review of systems were reviewed and are negative except what is noted above in HPI  Physical Exam: BP (!) 146/94   Pulse 92   Temp 98.1 F (36.7 C)   Ht 5\' 9"  (1.753 m)   Wt 210 lb (95.3 kg)   BMI 31.01 kg/m   Constitutional:  Alert and oriented, No acute distress. HEENT: [place] AT, moist mucus membranes.  Trachea midline, no masses. Cardiovascular: No clubbing, cyanosis, or edema. Respiratory: Normal respiratory effort, no increased work of breathing. GI: Abdomen is soft, nontender, nondistended, no abdominal masses GU: No CVA tenderness.  Lymph: No cervical or inguinal lymphadenopathy. Skin: No rashes, bruises or  suspicious lesions. Neurologic: Grossly intact, no focal deficits, moving all 4 extremities. Psychiatric: Normal mood and affect.  Laboratory Data: Lab Results  Component Value Date   WBC 8.5 05/11/2019   HGB 16.4 05/11/2019   HCT 49.1 05/11/2019   MCV 92.1 05/11/2019   PLT 194 05/11/2019    Lab Results  Component Value Date   CREATININE 1.02 05/11/2019    No results found for: PSA  No results found for: TESTOSTERONE  No results found for: HGBA1C  Urinalysis    Component Value Date/Time   BILIRUBINUR neg 10/07/2019 1334   PROTEINUR Negative 10/07/2019 1334   UROBILINOGEN 0.2 10/07/2019 1334   NITRITE neg 10/07/2019 1334   LEUKOCYTESUR Negative 10/07/2019 1334    No results found for: LABMICR, WBCUA,  RBCUA, LABEPIT, MUCUS, BACTERIA  Pertinent Imaging: MRi prostate: Images reviewed and discussed with patient. No results found for this or any previous visit. No results found for this or any previous visit. No results found for this or any previous visit. No results found for this or any previous visit. No results found for this or any previous visit. No results found for this or any previous visit. No results found for this or any previous visit. No results found for this or any previous visit.  Assessment & Plan:    1. Elevated PSA -RTC 6 months with PSA free and total   No follow-ups on file.  Patrick Bang, MD  [facility] [place]

## 2020-03-22 ENCOUNTER — Encounter: Payer: Self-pay | Admitting: Family Medicine

## 2020-03-22 ENCOUNTER — Other Ambulatory Visit: Payer: Self-pay

## 2020-03-22 ENCOUNTER — Ambulatory Visit: Payer: BLUE CROSS/BLUE SHIELD | Admitting: Family Medicine

## 2020-03-22 VITALS — BP 134/82 | HR 58 | Temp 97.9°F | Ht 69.0 in | Wt 218.8 lb

## 2020-03-22 DIAGNOSIS — R972 Elevated prostate specific antigen [PSA]: Secondary | ICD-10-CM

## 2020-03-22 DIAGNOSIS — Z8042 Family history of malignant neoplasm of prostate: Secondary | ICD-10-CM | POA: Diagnosis not present

## 2020-03-22 DIAGNOSIS — I10 Essential (primary) hypertension: Secondary | ICD-10-CM

## 2020-03-22 MED ORDER — AMLODIPINE BESYLATE 5 MG PO TABS: 5.0000 mg | ORAL_TABLET | Freq: Every day | ORAL | 3 refills | Status: DC

## 2020-03-22 NOTE — Progress Notes (Signed)
Subjective: CC: follow up hypertension HPI: Patrick Lambert is a 59 y.o. male presenting to clinic today for:  1.  Hypertension Compliant with Norvasc 5 mg daily.  No chest pain, shortness of breath, visual disturbance, lower extremity edema.  Overall he is doing well.  2.  Elevated PSA Patient had an elevation in his PSA.  However, apparently he had gotten his PSA level done after digital exam was performed at the urologist office.  There is a strong family history in his father of prostate cancer.  Likely work-up has been negative.  He will continue to follow-up with Dr Alyson Ingles as scheduled.   Past Medical History:  Diagnosis Date  . Family history of prostate cancer    Father  . Hypertension   . Vertigo    Social History   Socioeconomic History  . Marital status: Married    Spouse name: Hassan Rowan   . Number of children: 1  . Years of education: Not on file  . Highest education level: Not on file  Occupational History  . Occupation: retired   Tobacco Use  . Smoking status: Never Smoker  . Smokeless tobacco: Never Used  Vaping Use  . Vaping Use: Never used  Substance and Sexual Activity  . Alcohol use: Never  . Drug use: Never  . Sexual activity: Not on file  Other Topics Concern  . Not on file  Social History Narrative   Tactical/ Scientist, research (medical). Previously a Curator.   Social Determinants of Health   Financial Resource Strain:   . Difficulty of Paying Living Expenses:   Food Insecurity:   . Worried About Charity fundraiser in the Last Year:   . Arboriculturist in the Last Year:   Transportation Needs:   . Film/video editor (Medical):   Marland Kitchen Lack of Transportation (Non-Medical):   Physical Activity:   . Days of Exercise per Week:   . Minutes of Exercise per Session:   Stress:   . Feeling of Stress :   Social Connections:   . Frequency of Communication with Friends and Family:   . Frequency of Social Gatherings with Friends and Family:    . Attends Religious Services:   . Active Member of Clubs or Organizations:   . Attends Archivist Meetings:   Marland Kitchen Marital Status:   Intimate Partner Violence:   . Fear of Current or Ex-Partner:   . Emotionally Abused:   Marland Kitchen Physically Abused:   . Sexually Abused:    No outpatient medications have been marked as taking for the 03/22/20 encounter (Appointment) with Janora Norlander, DO.   Family History  Problem Relation Age of Onset  . Breast cancer Mother   . Lung cancer Mother   . Prostate cancer Father   . Stomach cancer Father   . Cancer Maternal Grandfather        prostate    Allergies  Allergen Reactions  . Bee Venom Anaphylaxis  . Penicillins Hives, Shortness Of Breath and Itching    Did it involve swelling of the face/tongue/throat, SOB, or low BP? No Did it involve sudden or severe rash/hives, skin peeling, or any reaction on the inside of your mouth or nose? Yes Did you need to seek medical attention at a hospital or doctor's office? No When did it last happen?Unknown If all above answers are "NO", may proceed with cephalosporin use.   . Shellfish Allergy Anaphylaxis    Patient states it is all seafood.  Marland Kitchen  Levaquin [Levofloxacin] Hives     Health Maintenance: Done 5 years ago in [place].  Release of information form completed.  ROS: Per HPI  Objective: Office vital signs reviewed. BP (!) 134/82   Pulse 58   Temp 97.9 F (36.6 C)   Ht 5' 9" (1.753 m)   Wt (!) 218 lb 12.8 oz (99.2 kg)   SpO2 98%   BMI 32.31 kg/m   Physical Examination:  General: Awake, alert, well nourished, No acute distress HEENT: Normal, sclera white, MMM Cardio: regular rate and rhythm, S1S2 heard, no murmurs appreciated Pulm: clear to auscultation bilaterally, no wheezes, rhonchi or rales; normal work of breathing on room air  Assessment/ Plan: 59 y.o. male   1. Essential hypertension Controlled.  Continue on regimen.  Okay to follow-up in 6 months for  annual physical exam with fasting labs - CMP14+EGFR; Future - Lipid panel; Future  2. Elevated PSA Thankfully had a negative work-up.  He will continue to follow-up with urology as scheduled  3. Family history of prostate cancer in father     Janora Norlander, [place] [phone]

## 2020-06-06 ENCOUNTER — Ambulatory Visit: Payer: BLUE CROSS/BLUE SHIELD | Admitting: Urology

## 2020-09-24 ENCOUNTER — Other Ambulatory Visit: Payer: Self-pay

## 2020-09-24 ENCOUNTER — Encounter: Payer: Self-pay | Admitting: Family Medicine

## 2020-09-24 ENCOUNTER — Ambulatory Visit: Payer: BLUE CROSS/BLUE SHIELD | Admitting: Family Medicine

## 2020-09-24 VITALS — BP 129/87 | HR 68 | Temp 97.4°F | Ht 69.0 in | Wt 216.0 lb

## 2020-09-24 DIAGNOSIS — I10 Essential (primary) hypertension: Secondary | ICD-10-CM

## 2020-09-24 DIAGNOSIS — Z8042 Family history of malignant neoplasm of prostate: Secondary | ICD-10-CM

## 2020-09-24 DIAGNOSIS — R972 Elevated prostate specific antigen [PSA]: Secondary | ICD-10-CM

## 2020-09-24 NOTE — Patient Instructions (Signed)

## 2020-09-24 NOTE — Progress Notes (Signed)
Subjective: CC: Hypertension PCP: Janora Norlander, DO DDU:KGURKY Galer is a 60 y.o. male presenting to clinic today for:  1.  Hypertension Patient reports that he has been doing very well.  No chest pain, shortness of breath or visual disturbance.  Compliant with Norvasc.  He recently got back from an [place] trip with his wife and they had attended passive time.  Planning on going to [facility] soon.   ROS: Per HPI  Allergies  Allergen Reactions  . Bee Venom Anaphylaxis  . Penicillins Hives, Shortness Of Breath and Itching    Did it involve swelling of the face/tongue/throat, SOB, or low BP? No Did it involve sudden or severe rash/hives, skin peeling, or any reaction on the inside of your mouth or nose? Yes Did you need to seek medical attention at a hospital or doctor's office? No When did it last happen?Unknown If all above answers are "NO", may proceed with cephalosporin use.   . Shellfish Allergy Anaphylaxis    Patient states it is all seafood.  [place] [Levofloxacin] Hives   Past Medical History:  Diagnosis Date  . Family history of prostate cancer    Father  . Hypertension   . Vertigo     Current Outpatient Medications:  .  amLODipine (NORVASC) 5 MG tablet, Take 1 tablet (5 mg total) by mouth daily., Disp: 90 tablet, Rfl: 3 Social History   Socioeconomic History  . Marital status: Married    Spouse name: Hassan Rowan   . Number of children: 1  . Years of education: Not on file  . Highest education level: Not on file  Occupational History  . Occupation: retired   Tobacco Use  . Smoking status: Never Smoker  . Smokeless tobacco: Never Used  Vaping Use  . Vaping Use: Never used  Substance and Sexual Activity  . Alcohol use: Never  . Drug use: Never  . Sexual activity: Not on file  Other Topics Concern  . Not on file  Social History Narrative   Tactical/ Scientist, research (medical). Previously a Curator.   Social Determinants of  Health   Financial Resource Strain: Not on file  Food Insecurity: Not on file  Transportation Needs: Not on file  Physical Activity: Not on file  Stress: Not on file  Social Connections: Not on file  Intimate Partner Violence: Not on file   Family History  Problem Relation Age of Onset  . Breast cancer Mother   . Lung cancer Mother   . Prostate cancer Father   . Stomach cancer Father   . Cancer Maternal Grandfather        prostate     Objective: Office vital signs reviewed. BP 129/87   Pulse 68   Temp (!) 97.4 F (36.3 C) (Temporal)   Ht 5' 9"  (1.753 m)   Wt 216 lb (98 kg)   SpO2 99%   BMI 31.90 kg/m   Physical Examination:  General: Awake, alert, well nourished, No acute distress HEENT: Normal; sclera white.  No carotid bruits Cardio: regular rate and rhythm, S1S2 heard, no murmurs appreciated Pulm: clear to auscultation bilaterally, no wheezes, rhonchi or rales; normal work of breathing on room air Extremities: warm, well perfused, No edema, cyanosis or clubbing; +2 pulses bilaterally MSK: normal gait and station  Assessment/ Plan: 60 y.o. male   Primary hypertension - Plan: CMP14+EGFR, Lipid panel, TSH, TSH, Lipid panel, CMP14+EGFR  Family history of prostate cancer - Plan: PSA, total and free, CBC, CBC, PSA,  total and free  Elevated PSA - Plan: PSA, total and free, CBC, CBC, PSA, total and free  Blood pressure under excellent control.  He is fasting today and therefore his CMP, lipid panel and TSH were obtained  Check prostate level.  This is been CCed to his urologist   Orders Placed This Encounter  Procedures  . CMP14+EGFR    Standing Status:   Future    Standing Expiration Date:   09/24/2021  . Lipid panel    Standing Status:   Future    Standing Expiration Date:   09/24/2021  . TSH    Standing Status:   Future    Standing Expiration Date:   09/24/2021  . PSA, total and free    Standing Status:   Future    Standing Expiration Date:   09/24/2021     Order Specific Question:   CC Results    Answer:   Cleon Gustin [[id number]]  . CBC    Standing Status:   Future    Standing Expiration Date:   09/24/2021   No orders of the defined types were placed in this encounter.    Janora Norlander, DO [place] [phone]

## 2020-09-25 LAB — CMP14+EGFR
ALT: 15 IU/L (ref 0–44)
ALT: 12 IU/L (ref 0–44)
AST: 17 IU/L (ref 0–40)
AST: 18 IU/L (ref 0–40)
Albumin/Globulin Ratio: 2 (ref 1.2–2.2)
Albumin/Globulin Ratio: 1.8 (ref 1.2–2.2)
Albumin: 4.5 g/dL (ref 3.8–4.9)
Albumin: 4.4 g/dL (ref 3.8–4.9)
Alkaline Phosphatase: 71 IU/L (ref 44–121)
Alkaline Phosphatase: 72 IU/L (ref 44–121)
BUN/Creatinine Ratio: 13 (ref 10–24)
BUN/Creatinine Ratio: 13 (ref 10–24)
BUN: 17 mg/dL (ref 8–27)
BUN: 18 mg/dL (ref 8–27)
Bilirubin Total: 0.5 mg/dL (ref 0.0–1.2)
Bilirubin Total: 0.5 mg/dL (ref 0.0–1.2)
CO2: 27 mmol/L (ref 20–29)
CO2: 27 mmol/L (ref 20–29)
Calcium: 9.5 mg/dL (ref 8.6–10.2)
Calcium: 9.6 mg/dL (ref 8.6–10.2)
Chloride: 102 mmol/L (ref 96–106)
Chloride: 103 mmol/L (ref 96–106)
Creatinine, Ser: 1.28 mg/dL — ABNORMAL HIGH (ref 0.76–1.27)
Creatinine, Ser: 1.34 mg/dL — ABNORMAL HIGH (ref 0.76–1.27)
GFR calc Af Amer: 70 mL/min/{1.73_m2} (ref >59)
GFR calc Af Amer: 66 mL/min/{1.73_m2} (ref >59)
GFR calc non Af Amer: 60 mL/min/{1.73_m2} (ref >59)
GFR calc non Af Amer: 57 mL/min/{1.73_m2} — ABNORMAL LOW (ref >59)
Globulin, Total: 2.3 g/dL (ref 1.5–4.5)
Globulin, Total: 2.4 g/dL (ref 1.5–4.5)
Glucose: 74 mg/dL (ref 65–99)
Glucose: 76 mg/dL (ref 65–99)
Potassium: 4.2 mmol/L (ref 3.5–5.2)
Potassium: 4.2 mmol/L (ref 3.5–5.2)
Sodium: 142 mmol/L (ref 134–144)
Sodium: 142 mmol/L (ref 134–144)
Total Protein: 6.8 g/dL (ref 6.0–8.5)
Total Protein: 6.8 g/dL (ref 6.0–8.5)

## 2020-09-25 LAB — CBC
Hematocrit: 47.9 % (ref 37.5–51.0)
Hemoglobin: 16.4 g/dL (ref 13.0–17.7)
MCH: 30.7 pg (ref 26.6–33.0)
MCHC: 34.2 g/dL (ref 31.5–35.7)
MCV: 90 fL (ref 79–97)
Platelets: 218 10*3/uL (ref 150–450)
RBC: 5.34 x10E6/uL (ref 4.14–5.80)
RDW: 13 % (ref 11.6–15.4)
WBC: 6.7 10*3/uL (ref 3.4–10.8)

## 2020-09-25 LAB — LIPID PANEL
Chol/HDL Ratio: 4.4 ratio (ref 0.0–5.0)
Chol/HDL Ratio: 4.4 ratio (ref 0.0–5.0)
Cholesterol, Total: 180 mg/dL (ref 100–199)
Cholesterol, Total: 183 mg/dL (ref 100–199)
HDL: 41 mg/dL (ref >39)
HDL: 42 mg/dL (ref >39)
LDL Chol Calc (NIH): 109 mg/dL — ABNORMAL HIGH (ref 0–99)
LDL Chol Calc (NIH): 109 mg/dL — ABNORMAL HIGH (ref 0–99)
Triglycerides: 170 mg/dL — ABNORMAL HIGH (ref 0–149)
Triglycerides: 181 mg/dL — ABNORMAL HIGH (ref 0–149)
VLDL Cholesterol Cal: 30 mg/dL (ref 5–40)
VLDL Cholesterol Cal: 32 mg/dL (ref 5–40)

## 2020-09-25 LAB — PSA, TOTAL AND FREE
PSA, Free Pct: 25.2 %
PSA, Free: 1.21 ng/mL
Prostate Specific Ag, Serum: 4.8 ng/mL — ABNORMAL HIGH (ref 0.0–4.0)

## 2020-09-25 LAB — TSH: TSH: 1.32 u[IU]/mL (ref 0.450–4.500)

## 2020-10-10 ENCOUNTER — Ambulatory Visit: Payer: BLUE CROSS/BLUE SHIELD | Admitting: Urology

## 2020-10-10 ENCOUNTER — Ambulatory Visit (INDEPENDENT_AMBULATORY_CARE_PROVIDER_SITE_OTHER): Payer: BLUE CROSS/BLUE SHIELD | Admitting: Urology

## 2020-10-10 ENCOUNTER — Encounter: Payer: Self-pay | Admitting: Urology

## 2020-10-10 ENCOUNTER — Other Ambulatory Visit: Payer: Self-pay

## 2020-10-10 VITALS — BP 137/94 | HR 97 | Temp 99.5°F | Ht 69.0 in | Wt 206.0 lb

## 2020-10-10 DIAGNOSIS — R972 Elevated prostate specific antigen [PSA]: Secondary | ICD-10-CM

## 2020-10-10 LAB — MICROSCOPIC EXAMINATION
Bacteria, UA: NONE SEEN
Epithelial Cells (non renal): NONE SEEN /hpf (ref 0–10)
RBC, Urine: NONE SEEN /hpf (ref 0–2)
Renal Epithel, UA: NONE SEEN /hpf
WBC, UA: NONE SEEN /hpf (ref 0–5)

## 2020-10-10 LAB — URINALYSIS, ROUTINE W REFLEX MICROSCOPIC
Bilirubin, UA: NEGATIVE
Glucose, UA: NEGATIVE
Leukocytes,UA: NEGATIVE
Nitrite, UA: NEGATIVE
RBC, UA: NEGATIVE
Specific Gravity, UA: 1.02 (ref 1.005–1.030)
Urobilinogen, Ur: 0.2 mg/dL (ref 0.2–1.0)
pH, UA: 5 (ref 5.0–7.5)

## 2020-10-10 NOTE — Progress Notes (Signed)

## 2020-10-10 NOTE — Progress Notes (Signed)
10/10/2020 2:19 PM   Patrick Lambert 1960-11-14 [id number]  Referring provider: Raliegh Ip, DO [address] [place],  [place] [address]  followup elevated PSA  HPI: Patrick Lambert is a 59yo here for followup for elevated PSA. PSA is stable at 4.8. NO worsening LUTS. NO new complaints    PMH: Past Medical History:  Diagnosis Date  . Family history of prostate cancer    Father  . Hypertension   . Vertigo     Surgical History: Past Surgical History:  Procedure Laterality Date  . BIOPSY PROSTATE     Negative  . HERNIA REPAIR  1998    Home Medications:  Allergies as of 10/10/2020      Reactions   Bee Venom Anaphylaxis   Penicillins Hives, Shortness Of Breath, Itching   Did it involve swelling of the face/tongue/throat, SOB, or low BP? No Did it involve sudden or severe rash/hives, skin peeling, or any reaction on the inside of your mouth or nose? Yes Did you need to seek medical attention at a hospital or doctor's office? No When did it last happen?Unknown If all above answers are "NO", may proceed with cephalosporin use.   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   Levaquin [levofloxacin] Hives      Medication List       Accurate as of October 10, 2020  2:19 PM. If you have any questions, ask your nurse or doctor.        amLODipine 5 MG tablet Commonly known as: NORVASC Take 1 tablet (5 mg total) by mouth daily.       Allergies:  Allergies  Allergen Reactions  . Bee Venom Anaphylaxis  . Penicillins Hives, Shortness Of Breath and Itching    Did it involve swelling of the face/tongue/throat, SOB, or low BP? No Did it involve sudden or severe rash/hives, skin peeling, or any reaction on the inside of your mouth or nose? Yes Did you need to seek medical attention at a hospital or doctor's office? No When did it last happen?Unknown If all above answers are "NO", may proceed with cephalosporin use.   . Shellfish  Allergy Anaphylaxis    Patient states it is all seafood.  [place] [Levofloxacin] Hives    Family History: Family History  Problem Relation Age of Onset  . Breast cancer Mother   . Lung cancer Mother   . Prostate cancer Father   . Stomach cancer Father   . Cancer Maternal Grandfather        prostate     Social History:  reports that he has never smoked. He has never used smokeless tobacco. He reports that he does not drink alcohol and does not use drugs.  ROS: All other review of systems were reviewed and are negative except what is noted above in HPI  Physical Exam: BP (!) 137/94   Pulse 97   Temp 99.5 F (37.5 C)   Ht 5\' 9"  (1.753 m)   Wt 206 lb (93.4 kg)   BMI 30.42 kg/m   Constitutional:  Alert and oriented, No acute distress. HEENT: [place] AT, moist mucus membranes.  Trachea midline, no masses. Cardiovascular: No clubbing, cyanosis, or edema. Respiratory: Normal respiratory effort, no increased work of breathing. GI: Abdomen is soft, nontender, nondistended, no abdominal masses GU: No CVA tenderness.  Lymph: No cervical or inguinal lymphadenopathy. Skin: No rashes, bruises or suspicious lesions. Neurologic: Grossly intact, no focal deficits, moving all 4 extremities.  Psychiatric: Normal mood and affect.  Laboratory Data: Lab Results  Component Value Date   WBC 6.7 09/24/2020   HGB 16.4 09/24/2020   HCT 47.9 09/24/2020   MCV 90 09/24/2020   PLT 218 09/24/2020    Lab Results  Component Value Date   CREATININE 1.28 (H) 09/24/2020    No results found for: PSA  No results found for: TESTOSTERONE  No results found for: HGBA1C  Urinalysis    Component Value Date/Time   BILIRUBINUR neg 10/07/2019 1334   PROTEINUR Negative 10/07/2019 1334   UROBILINOGEN 0.2 10/07/2019 1334   NITRITE neg 10/07/2019 1334   LEUKOCYTESUR Negative 10/07/2019 1334    No results found for: LABMICR, WBCUA, RBCUA, LABEPIT, MUCUS, BACTERIA  Pertinent Imaging:  No results  found for this or any previous visit.  No results found for this or any previous visit.  No results found for this or any previous visit.  No results found for this or any previous visit.  No results found for this or any previous visit.  No results found for this or any previous visit.  No results found for this or any previous visit.  No results found for this or any previous visit.   Assessment & Plan:    1. Elevated PSA -RTC 1 year with PSA and DRE - Urinalysis, Routine w reflex microscopic - PSA, total and free; Future   No follow-ups on file.  Wilkie Aye, MD  [facility] [place]

## 2020-10-10 NOTE — Patient Instructions (Signed)
  Prostate-Specific Antigen Test Why am I having this test? The prostate-specific antigen (PSA) test is a screening test for prostate cancer. It can identify early signs of prostate cancer, which may allow for more effective treatment. Your health care provider may recommend that you have a PSA test starting at age 61 or that you have one earlier or later, depending on your risk factors for prostate cancer. You may also have a PSA test:  To monitor treatment of prostate cancer.  To check whether prostate cancer has returned after treatment.  If you have signs of other conditions that can affect PSA levels, such as: ? An enlarged prostate that is not caused by cancer (benign prostatic hyperplasia, BPH). This condition is very common in older men. ? A prostate infection. What is being tested? This test measures the amount of PSA in your blood. PSA is a protein that is made in the prostate. The prostate naturally produces more PSA as you age, but very high levels may be a sign of a medical condition. What kind of sample is taken? A blood sample is required for this test. It is usually collected by inserting a needle into a blood vessel or by sticking a finger with a small needle. Blood for this test should be drawn before having an exam of the prostate.   How do I prepare for this test? Do not ejaculate starting 24 hours before your test, or as long as told by your health care provider. Tell a health care provider about:  Any allergies you have.  All medicines you are taking, including vitamins, herbs, eye drops, creams, and over-the-counter medicines. This also includes: ? Medicines to assist with hair growth, such as finasteride. ? Any recent exposure to a medicine called diethylstilbestrol.  Any blood disorders you have.  Any recent procedures you have had, especially any procedures involving the prostate or rectum.  Any medical conditions you have.  Any recent urinary tract  infections (UTIs) you have had. How are the results reported? Your test results will be reported as a value that indicates how much PSA is in your blood. This will be given as nanograms of PSA per milliliter of blood (ng/mL). Your health care provider will compare your results to normal ranges that were established after testing a large group of people (reference ranges). Reference ranges may vary among labs and hospitals. PSA levels vary from person to person and generally increase with age. Because of this variation, there is no single PSA value that is considered normal for everyone. Instead, PSA reference ranges are used to describe whether your PSA levels are considered low or high (elevated). Common reference ranges are:  Low: 0-2.5 ng/mL.  Slightly to moderately elevated: 2.6-10.0 ng/mL.  Moderately elevated: 10.0-19.9 ng/mL.  Significantly elevated: 20 ng/mL or greater. Sometimes, the test results may report that a condition is present when it is not present (false-positive result). What do the results mean? A test result that is higher than 4 ng/mL may mean that you are at an increased risk for prostate cancer. However, a PSA test by itself is not enough to diagnose prostate cancer. High PSA levels may also be caused by the natural aging process, prostate infection, or BPH. PSA screening cannot tell you if your PSA is high due to cancer or a different cause. A prostate biopsy is the only way to diagnose prostate cancer. A risk of having the PSA test is diagnosing and treating prostate cancer that would never   have caused any symptoms or problems (overdiagnosis and overtreatment). Talk with your health care provider about what your results mean. Questions to ask your health care provider Ask your health care provider, or the department that is doing the test:  When will my results be ready?  How will I get my results?  What are my treatment options?  What other tests do I  need?  What are my next steps? Summary  The prostate-specific antigen (PSA) test is a screening test for prostate cancer.  Your health care provider may recommend that you have a PSA test starting at age 61 or that you have one earlier or later, depending on your risk factors for prostate cancer.  A test result that is higher than 4 ng/mL may mean that you are at an increased risk for prostate cancer. However, elevated levels can be caused by a number of conditions other than prostate cancer.  Talk with your health care provider about what your results mean. This information is not intended to replace advice given to you by your health care provider. Make sure you discuss any questions you have with your health care provider. Document Revised: 05/03/2020 Document Reviewed: 05/03/2020 Elsevier Patient Education  2021 Elsevier Inc.  

## 2020-10-12 ENCOUNTER — Ambulatory Visit: Payer: BLUE CROSS/BLUE SHIELD | Admitting: Urology

## 2021-03-25 ENCOUNTER — Encounter: Payer: Self-pay | Admitting: Family Medicine

## 2021-03-25 ENCOUNTER — Ambulatory Visit (INDEPENDENT_AMBULATORY_CARE_PROVIDER_SITE_OTHER): Payer: BLUE CROSS/BLUE SHIELD | Admitting: Family Medicine

## 2021-03-25 ENCOUNTER — Other Ambulatory Visit: Payer: Self-pay

## 2021-03-25 VITALS — BP 147/92 | HR 64 | Temp 98.0°F | Ht 69.0 in | Wt 217.4 lb

## 2021-03-25 DIAGNOSIS — R972 Elevated prostate specific antigen [PSA]: Secondary | ICD-10-CM

## 2021-03-25 DIAGNOSIS — E7800 Pure hypercholesterolemia, unspecified: Secondary | ICD-10-CM

## 2021-03-25 DIAGNOSIS — Z0000 Encounter for general adult medical examination without abnormal findings: Secondary | ICD-10-CM

## 2021-03-25 DIAGNOSIS — I10 Essential (primary) hypertension: Secondary | ICD-10-CM | POA: Diagnosis not present

## 2021-03-25 DIAGNOSIS — Z8042 Family history of malignant neoplasm of prostate: Secondary | ICD-10-CM | POA: Diagnosis not present

## 2021-03-25 DIAGNOSIS — Z0001 Encounter for general adult medical examination with abnormal findings: Secondary | ICD-10-CM | POA: Diagnosis not present

## 2021-03-25 NOTE — Patient Instructions (Signed)
Health Maintenance, Male Adopting a healthy lifestyle and getting preventive care are important in promoting health and wellness. Ask your health care provider about: The right schedule for you to have regular tests and exams. Things you can do on your own to prevent diseases and keep yourself healthy. What should I know about diet, weight, and exercise? Eat a healthy diet  Eat a diet that includes plenty of vegetables, fruits, low-fat dairy products, and lean protein. Do not eat a lot of foods that are high in solid fats, added sugars, or sodium.  Maintain a healthy weight Body mass index (BMI) is a measurement that can be used to identify possible weight problems. It estimates body fat based on height and weight. Your health care provider can help determine your BMI and help you achieve or maintain ahealthy weight. Get regular exercise Get regular exercise. This is one of the most important things you can do for your health. Most adults should: Exercise for at least 150 minutes each week. The exercise should increase your heart rate and make you sweat (moderate-intensity exercise). Do strengthening exercises at least twice a week. This is in addition to the moderate-intensity exercise. Spend less time sitting. Even light physical activity can be beneficial. Watch cholesterol and blood lipids Have your blood tested for lipids and cholesterol at 61 years of age, then havethis test every 5 years. You may need to have your cholesterol levels checked more often if: Your lipid or cholesterol levels are high. You are older than 61 years of age. You are at high risk for heart disease. What should I know about cancer screening? Many types of cancers can be detected early and may often be prevented. Depending on your health history and family history, you may need to have cancer screening at various ages. This may include screening for: Colorectal cancer. Prostate cancer. Skin cancer. Lung  cancer. What should I know about heart disease, diabetes, and high blood pressure? Blood pressure and heart disease High blood pressure causes heart disease and increases the risk of stroke. This is more likely to develop in people who have high blood pressure readings, are of African descent, or are overweight. Talk with your health care provider about your target blood pressure readings. Have your blood pressure checked: Every 3-5 years if you are 18-39 years of age. Every year if you are 40 years old or older. If you are between the ages of 65 and 75 and are a current or former smoker, ask your health care provider if you should have a one-time screening for abdominal aortic aneurysm (AAA). Diabetes Have regular diabetes screenings. This checks your fasting blood sugar level. Have the screening done: Once every three years after age 45 if you are at a normal weight and have a low risk for diabetes. More often and at a younger age if you are overweight or have a high risk for diabetes. What should I know about preventing infection? Hepatitis B If you have a higher risk for hepatitis B, you should be screened for this virus. Talk with your health care provider to find out if you are at risk forhepatitis B infection. Hepatitis C Blood testing is recommended for: Everyone born from 1945 through 1965. Anyone with known risk factors for hepatitis C. Sexually transmitted infections (STIs) You should be screened each year for STIs, including gonorrhea and chlamydia, if: You are sexually active and are younger than 61 years of age. You are older than 61 years of age   and your health care provider tells you that you are at risk for this type of infection. Your sexual activity has changed since you were last screened, and you are at increased risk for chlamydia or gonorrhea. Ask your health care provider if you are at risk. Ask your health care provider about whether you are at high risk for HIV.  Your health care provider may recommend a prescription medicine to help prevent HIV infection. If you choose to take medicine to prevent HIV, you should first get tested for HIV. You should then be tested every 3 months for as long as you are taking the medicine. Follow these instructions at home: Lifestyle Do not use any products that contain nicotine or tobacco, such as cigarettes, e-cigarettes, and chewing tobacco. If you need help quitting, ask your health care provider. Do not use street drugs. Do not share needles. Ask your health care provider for help if you need support or information about quitting drugs. Alcohol use Do not drink alcohol if your health care provider tells you not to drink. If you drink alcohol: Limit how much you have to 0-2 drinks a day. Be aware of how much alcohol is in your drink. In the [place] one drink equals one 12 oz bottle of beer (355 mL), one 5 oz glass of wine (148 mL), or one 1 oz glass of hard liquor (44 mL). General instructions Schedule regular health, dental, and eye exams. Stay current with your vaccines. Tell your health care provider if: You often feel depressed. You have ever been abused or do not feel safe at home. Summary Adopting a healthy lifestyle and getting preventive care are important in promoting health and wellness. Follow your health care provider's instructions about healthy diet, exercising, and getting tested or screened for diseases. Follow your health care provider's instructions on monitoring your cholesterol and blood pressure. This information is not intended to replace advice given to you by your health care provider. Make sure you discuss any questions you have with your healthcare provider. Document Revised: 08/11/2018 Document Reviewed: 08/11/2018 Elsevier Patient Education  2022 Elsevier Inc.  

## 2021-03-25 NOTE — Progress Notes (Signed)
Patrick Lambert is a 60 y.o. male presents to office today for annual physical exam examination.    Concerns today include: 1.  Hypertension Patient is compliant with Norvasc 5 mg daily.  No chest pain, shortness of breath.  He remains physically active.  Blood pressures at home are at goal.  2.  Elevated PSA with family history of prostate cancer in his father Patient is closely followed by Dr. Alyson Ingles.  Would like to go ahead and get PSA labs with other fasting labs.  Denies any genital urinary concerns.  Urine output is normal.  Occupation: Art therapist, Marital status: married, Substance use: none Diet: balanced (grows own fruits/ veggies, soon to have chickens for eggs), Exercise: active Last eye exam: UTD Last dental exam: UTD Last colonoscopy: needs  Refills needed today: norvasc Immunizations needed: Flu Vaccine: declines shingrix Immunization History  Administered Date(s) Administered   Influenza, Quadrivalent, Recombinant, Inj, Pf 06/09/2019   Moderna Sars-Covid-2 Vaccination 12/21/2019, 01/18/2020     Past Medical History:  Diagnosis Date   Family history of prostate cancer    Father   Hypertension    Vertigo    Social History   Socioeconomic History   Marital status: Married    Spouse name: Patrick Lambert    Number of children: 1   Years of education: Not on file   Highest education level: Not on file  Occupational History   Occupation: retired   Tobacco Use   Smoking status: Never   Smokeless tobacco: Never  Vaping Use   Vaping Use: Never used  Substance and Sexual Activity   Alcohol use: Never   Drug use: Never   Sexual activity: Not on file  Other Topics Concern   Not on file  Social History Narrative   Scientist, 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   Social Determinants of Health   Financial Resource Strain: Not on file  Food Insecurity: Not on file  Transportation Needs: Not on file  Physical Activity: Not on file  Stress: Not  on file  Social Connections: Not on file  Intimate Partner Violence: Not on file   Past Surgical History:  Procedure Laterality Date   BIOPSY PROSTATE     Negative   HERNIA REPAIR  1998   Family History  Problem Relation Age of Onset   Breast cancer Mother    Lung cancer Mother    Prostate cancer Father    Stomach cancer Father    Cancer Maternal Grandfather        prostate     Current Outpatient Medications:    amLODipine (NORVASC) 5 MG tablet, Take 1 tablet (5 mg total) by mouth daily., Disp: 90 tablet, Rfl: 3  Allergies  Allergen Reactions   Bee Venom Anaphylaxis   Penicillins Hives, Shortness Of Breath and Itching    Did it involve swelling of the face/tongue/throat, SOB, or low BP? No Did it involve sudden or severe rash/hives, skin peeling, or any reaction on the inside of your mouth or nose? Yes Did you need to seek medical attention at a hospital or doctor's office? No When did it last happen?      Unknown If all above answers are "NO", may proceed with cephalosporin use.    Shellfish Allergy Anaphylaxis    Patient states it is all seafood.   Levaquin [Levofloxacin] Hives     ROS: Review of Systems Pertinent items noted in HPI and remainder of comprehensive ROS otherwise negative.    Physical exam BP Marland Kitchen)  147/92   Pulse 64   Temp 98 F (36.7 C)   Ht _0  (1.753 m)   Wt 217 lb 6.4 oz (98.6 kg)   SpO2 98%   BMI 32.10 kg/m  General appearance: alert, cooperative, appears stated age, and no distress Head: Normocephalic, without obvious abnormality, atraumatic Eyes: negative findings: lids and lashes normal, conjunctivae and sclerae normal, corneas clear, and pupils equal, round, reactive to light and accomodation Ears: normal TM's and external ear canals both ears Nose: Nares normal. Septum midline. Mucosa normal. No drainage or sinus tenderness. Throat: lips, mucosa, and tongue normal; teeth and gums normal Neck: no adenopathy, no carotid bruit, supple,  symmetrical, trachea midline, and thyroid not enlarged, symmetric, no tenderness/mass/nodules Back: symmetric, no curvature. ROM normal. No CVA tenderness. Lungs: clear to auscultation bilaterally Chest wall: no tenderness Heart: regular rate and rhythm, S1, S2 normal, no murmur, click, rub or gallop Abdomen: soft, non-tender; bowel sounds normal; no masses,  no organomegaly Extremities: extremities normal, atraumatic, no cyanosis or edema Pulses: 2+ and symmetric Skin: Skin color, texture, turgor normal. No rashes or lesions Lymph nodes: Cervical, supraclavicular, and axillary nodes normal. Neurologic: Alert and oriented X 3, normal strength and tone. Normal symmetric reflexes. Normal coordination and gait Psych: Mood stable, speech normal, affect appropriate.  Patient is very pleasant and interactive. Depression screen [facility] 2/9 03/25/2021 09/24/2020 03/22/2020  Decreased Interest 0 0 0  Down, Depressed, Hopeless 0 0 0  PHQ - 2 Score 0 0 0  Altered sleeping - 0 -  Tired, decreased energy - 0 -  Change in appetite - 0 -  Feeling bad or failure about yourself  - 0 -  Trouble concentrating - 0 -  Moving slowly or fidgety/restless - 0 -  Suicidal thoughts - 0 -  PHQ-9 Score - 0 -    Assessment/ Plan: [name] here for annual physical exam.   Annual physical exam  Primary hypertension - Plan: CMP14+EGFR, Lipid panel, TSH  Pure hypercholesterolemia - Plan: CMP14+EGFR, Lipid panel, TSH  Family history of prostate cancer - Plan: PSA, CANCELED: PSA  Elevated PSA - Plan: CBC, PSA, CANCELED: PSA  He will return for fasting labs.  Blood pressure is controlled upon recheck.  No changes made.  Continue to monitor closely.  Plan for fasting lipid panel, TSH and CMP.  Check PSA given history of elevation in the past family history of prostate cancer.  Will CC to Dr. Alyson Ingles  May follow-up in 6 months, sooner if  Counseled on healthy lifestyle choices, including diet (rich in  fruits, vegetables and lean meats and low in salt and simple carbohydrates) and exercise (at least 30 minutes of moderate physical activity daily).  Patient to follow up in 1 year for annual exam or sooner if needed.  Mattheus Rauls M. Lajuana Ripple, DO

## 2021-03-27 ENCOUNTER — Other Ambulatory Visit: Payer: Self-pay

## 2021-03-27 ENCOUNTER — Other Ambulatory Visit: Payer: BLUE CROSS/BLUE SHIELD

## 2021-03-27 ENCOUNTER — Other Ambulatory Visit: Payer: Self-pay | Admitting: Family Medicine

## 2021-03-27 DIAGNOSIS — E7800 Pure hypercholesterolemia, unspecified: Secondary | ICD-10-CM | POA: Diagnosis not present

## 2021-03-27 DIAGNOSIS — Z8042 Family history of malignant neoplasm of prostate: Secondary | ICD-10-CM | POA: Diagnosis not present

## 2021-03-27 DIAGNOSIS — I10 Essential (primary) hypertension: Secondary | ICD-10-CM

## 2021-03-27 DIAGNOSIS — R972 Elevated prostate specific antigen [PSA]: Secondary | ICD-10-CM | POA: Diagnosis not present

## 2021-03-28 LAB — CBC
Hematocrit: 45 % (ref 37.5–51.0)
Hemoglobin: 15.8 g/dL (ref 13.0–17.7)
MCH: 31.2 pg (ref 26.6–33.0)
MCHC: 35.1 g/dL (ref 31.5–35.7)
MCV: 89 fL (ref 79–97)
Platelets: 191 10*3/uL (ref 150–450)
RBC: 5.07 x10E6/uL (ref 4.14–5.80)
RDW: 13.2 % (ref 11.6–15.4)
WBC: 6.9 10*3/uL (ref 3.4–10.8)

## 2021-03-28 LAB — CMP14+EGFR
ALT: 18 IU/L (ref 0–44)
AST: 15 IU/L (ref 0–40)
Albumin/Globulin Ratio: 2.6 — ABNORMAL HIGH (ref 1.2–2.2)
Albumin: 4.4 g/dL (ref 3.8–4.9)
Alkaline Phosphatase: 61 IU/L (ref 44–121)
BUN/Creatinine Ratio: 11 (ref 10–24)
BUN: 12 mg/dL (ref 8–27)
Bilirubin Total: 0.5 mg/dL (ref 0.0–1.2)
CO2: 22 mmol/L (ref 20–29)
Calcium: 8.8 mg/dL (ref 8.6–10.2)
Chloride: 103 mmol/L (ref 96–106)
Creatinine, Ser: 1.06 mg/dL (ref 0.76–1.27)
Globulin, Total: 1.7 g/dL (ref 1.5–4.5)
Glucose: 87 mg/dL (ref 65–99)
Potassium: 4 mmol/L (ref 3.5–5.2)
Sodium: 140 mmol/L (ref 134–144)
Total Protein: 6.1 g/dL (ref 6.0–8.5)
eGFR: 80 mL/min/{1.73_m2} (ref >59)

## 2021-03-28 LAB — LIPID PANEL
Chol/HDL Ratio: 3.3 ratio (ref 0.0–5.0)
Cholesterol, Total: 163 mg/dL (ref 100–199)
HDL: 49 mg/dL (ref >39)
LDL Chol Calc (NIH): 101 mg/dL — ABNORMAL HIGH (ref 0–99)
Triglycerides: 67 mg/dL (ref 0–149)
VLDL Cholesterol Cal: 13 mg/dL (ref 5–40)

## 2021-03-28 LAB — PSA: Prostate Specific Ag, Serum: 5.5 ng/mL — ABNORMAL HIGH (ref 0.0–4.0)

## 2021-03-28 LAB — TSH: TSH: 1.12 u[IU]/mL (ref 0.450–4.500)

## 2021-06-12 ENCOUNTER — Encounter: Payer: Self-pay | Admitting: Urology

## 2021-06-12 ENCOUNTER — Ambulatory Visit (INDEPENDENT_AMBULATORY_CARE_PROVIDER_SITE_OTHER): Payer: BLUE CROSS/BLUE SHIELD | Admitting: Urology

## 2021-06-12 ENCOUNTER — Other Ambulatory Visit: Payer: Self-pay

## 2021-06-12 VITALS — BP 157/91 | HR 76

## 2021-06-12 DIAGNOSIS — N400 Enlarged prostate without lower urinary tract symptoms: Secondary | ICD-10-CM

## 2021-06-12 DIAGNOSIS — R972 Elevated prostate specific antigen [PSA]: Secondary | ICD-10-CM | POA: Diagnosis not present

## 2021-06-12 LAB — URINALYSIS, ROUTINE W REFLEX MICROSCOPIC
Bilirubin, UA: NEGATIVE
Glucose, UA: NEGATIVE
Ketones, UA: NEGATIVE
Leukocytes,UA: NEGATIVE
Nitrite, UA: NEGATIVE
Protein,UA: NEGATIVE
Specific Gravity, UA: 1.025 (ref 1.005–1.030)
Urobilinogen, Ur: 0.2 mg/dL (ref 0.2–1.0)
pH, UA: 5.5 (ref 5.0–7.5)

## 2021-06-12 LAB — MICROSCOPIC EXAMINATION
Bacteria, UA: NONE SEEN
Epithelial Cells (non renal): NONE SEEN /hpf (ref 0–10)
RBC, Urine: NONE SEEN /hpf (ref 0–2)
Renal Epithel, UA: NONE SEEN /hpf
WBC, UA: NONE SEEN /hpf (ref 0–5)

## 2021-06-12 MED ORDER — DIAZEPAM 10 MG PO TABS: 10.0000 mg | ORAL_TABLET | Freq: Once | ORAL | 0 refills | Status: AC

## 2021-06-12 MED ORDER — CIPROFLOXACIN HCL 500 MG PO TABS: 500.0000 mg | ORAL_TABLET | Freq: Once | ORAL | 0 refills | Status: AC

## 2021-06-12 NOTE — Progress Notes (Signed)
06/12/2021 10:53 AM   Patrick Lambert October 25, 1960 [id number]  Referring provider: Raliegh Ip, DO [address] [place],  [place] [address]  Elevated PSA   HPI: Patrick Lambert is a 59yo here for evaluation of elevated PSA. PSA was 4.8 last year and then 5.5 this year. His father was diagnosed with prostate cancer at age 19 and died of prostate cancer. He underwent prostate biopsy in 2018 which was negative. PSA at that time was 3.1.  IPSS 1 QOL 0. No other complaints today   PMH: Past Medical History:  Diagnosis Date   Family history of prostate cancer    Father   Hypertension    Vertigo     Surgical History: Past Surgical History:  Procedure Laterality Date   BIOPSY PROSTATE     Negative   HERNIA REPAIR  1998    Home Medications:  Allergies as of 06/12/2021       Reactions   Bee Venom Anaphylaxis   Penicillins Hives, Shortness Of Breath, Itching   Did it involve swelling of the face/tongue/throat, SOB, or low BP? No Did it involve sudden or severe rash/hives, skin peeling, or any reaction on the inside of your mouth or nose? Yes Did you need to seek medical attention at a hospital or doctor's office? No When did it last happen?      Unknown If all above answers are "NO", may proceed with cephalosporin use.   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   Levaquin [levofloxacin] Hives        Medication List        Accurate as of June 12, 2021 10:53 AM. If you have any questions, ask your nurse or doctor.          amLODipine 5 MG tablet Commonly known as: NORVASC TAKE 1 TABLET BY MOUTH EVERY DAY        Allergies:  Allergies  Allergen Reactions   Bee Venom Anaphylaxis   Penicillins Hives, Shortness Of Breath and Itching    Did it involve swelling of the face/tongue/throat, SOB, or low BP? No Did it involve sudden or severe rash/hives, skin peeling, or any reaction on the inside of your mouth or nose? Yes Did you need  to seek medical attention at a hospital or doctor's office? No When did it last happen?      Unknown If all above answers are "NO", may proceed with cephalosporin use.    Shellfish Allergy Anaphylaxis    Patient states it is all seafood.   Levaquin [Levofloxacin] Hives    Family History: Family History  Problem Relation Age of Onset   Breast cancer Mother    Lung cancer Mother    Prostate cancer Father    Stomach cancer Father    Cancer Maternal Grandfather        prostate     Social History:  reports that he has never smoked. He has never used smokeless tobacco. He reports that he does not drink alcohol and does not use drugs.  ROS: All other review of systems were reviewed and are negative except what is noted above in HPI  Physical Exam: BP (!) 157/91   Pulse 76   Constitutional:  Alert and oriented, No acute distress. HEENT: [place] AT, moist mucus membranes.  Trachea midline, no masses. Cardiovascular: No clubbing, cyanosis, or edema. Respiratory: Normal respiratory effort, no increased work of breathing. GI: Abdomen is soft, nontender, nondistended, no abdominal masses GU:  No CVA tenderness. Circumcised phallus. No masses/lesions on penis, testis, scrotum. Prostate 40g smooth no nodules no induration.  Lymph: No cervical or inguinal lymphadenopathy. Skin: No rashes, bruises or suspicious lesions. Neurologic: Grossly intact, no focal deficits, moving all 4 extremities. Psychiatric: Normal mood and affect.  Laboratory Data: Lab Results  Component Value Date   WBC 6.9 03/27/2021   HGB 15.8 03/27/2021   HCT 45.0 03/27/2021   MCV 89 03/27/2021   PLT 191 03/27/2021    Lab Results  Component Value Date   CREATININE 1.06 03/27/2021    No results found for: PSA  No results found for: TESTOSTERONE  No results found for: HGBA1C  Urinalysis    Component Value Date/Time   APPEARANCEUR Clear 10/10/2020 1426   GLUCOSEU Negative 10/10/2020 1426   BILIRUBINUR Negative  10/10/2020 1426   PROTEINUR 1+ (A) 10/10/2020 1426   UROBILINOGEN 0.2 10/07/2019 1334   NITRITE Negative 10/10/2020 1426   LEUKOCYTESUR Negative 10/10/2020 1426    Lab Results  Component Value Date   LABMICR See below: 10/10/2020   WBCUA None seen 10/10/2020   LABEPIT None seen 10/10/2020   MUCUS Present 10/10/2020   BACTERIA None seen 10/10/2020    Pertinent Imaging:  No results found for this or any previous visit.  No results found for this or any previous visit.  No results found for this or any previous visit.  No results found for this or any previous visit.  No results found for this or any previous visit.  No results found for this or any previous visit.  No results found for this or any previous visit.  No results found for this or any previous visit.   Assessment & Plan:    1. Elevated PSA The patient and I talked about etiologies of elevated PSA.  We discussed the possible relationship between elevated PSA, prostate cancer, BPH, prostatitis, and UTI.   Conservative treatment of elevated PSA with watchful waiting was discussed with the patient.  All questions were answered.        All of the risks and benefits along with alternatives to prostate biopsy were discussed with the patient.  The patient gave fully informed consent to proceed with a transrectal ultrasound guided biopsy of the prostate for the evaluation of their evated PSA.  Prostate biopsy instructions and antibiotics were given to the patient.  - Urinalysis, Routine w reflex microscopic     No follow-ups on file.  Wilkie Aye, MD  [facility] [place]

## 2021-06-12 NOTE — Progress Notes (Signed)

## 2021-06-12 NOTE — Patient Instructions (Addendum)
Transrectal Ultrasound-Guided Prostate Biopsy A transrectal ultrasound-guided prostate biopsy is a procedure to remove samples of prostate tissue for testing. The procedure uses ultrasound images to guide the process of removing the samples. The samples are taken to a lab to be checked for prostate cancer. This procedure is usually done to evaluate the prostate gland of men who have raised (elevated) levels of prostate-specific antigen (PSA), which can be a sign of prostate cancer. Tell a health care provider about: Any allergies you have. All medicines you are taking, including vitamins, herbs, eye drops, creams, and over-the-counter medicines. Any problems you or family members have had with anesthetic medicines. Any blood disorders you have. Any surgeries you have had. Any medical conditions you have. What are the risks? Generally, this is a safe procedure. However, problems may occur, including: Prostate infection. Bleeding from the rectum. Blood in the urine. Allergic reactions to medicines. Damage to surrounding structures such as blood vessels, organs, or muscles. Difficulty passing urine. Nerve damage. This is usually temporary. Pain. What happens before the procedure? Eating and drinking restrictions Follow instructions from your health care provider about eating and drinking. In most instances, you will not need to stop eating and drinking completely before the procedure. Medicines Ask your health care provider about: Changing or stopping your regular medicines. This is especially important if you are taking diabetes medicines or blood thinners. Taking medicines such as aspirin and ibuprofen. These medicines can thin your blood. Do not take these medicines unless your health care provider tells you to take them. Taking over-the-counter medicines, vitamins, herbs, and supplements. General instructions You will be given an enema. During an enema, a liquid is injected into your  rectum to clear out waste. You may have a blood or urine sample taken. Plan to have a responsible adult take you home from the hospital or clinic. If you will be going home right after the procedure, plan to have a responsible adult care for you for the time you are told. This is important. Ask your health care provider what steps will be taken to help prevent infection. These steps may include: Washing skin with a germ-killing soap. Taking antibiotic medicine. What happens during the procedure?  You will be given one or both of the following: A medicine to help you relax (sedative). A medicine to numb the area (local anesthetic). You will be placed on your left side, and your knees may be bent. A probe with lubricated gel will be placed into your rectum, and images will be taken of your prostate and surrounding structures. Numbing medicine will be injected into your prostate. A biopsy needle will be inserted through your rectum and guided to your prostate using the ultrasound images. Prostate tissue samples will be removed, and the needle will then be removed. The biopsy samples will be sent to a lab to be tested. The procedure may vary among health care providers and hospitals. What happens after the procedure? Your blood pressure, heart rate, breathing rate, and blood oxygen level will be monitored until the medicines you were given have worn off. You may have some discomfort in the rectal area. You will be given pain medicine as needed. Do not drive for 24 hours if you received a sedative. Summary A transrectal ultrasound-guided biopsy removes samples of tissue from your prostate. This procedure is usually done to evaluate the prostate gland of men who have raised (elevated) levels of prostate-specific antigen (PSA), which can be a sign of prostate cancer. After your  procedure, you may feel some discomfort in the rectal area. Plan to have a responsible adult take you home from the  hospital or clinic. This information is not intended to replace advice given to you by your health care provider. Make sure you discuss any questions you have with your health care provider. Document Revised: 06/01/2020 Document Reviewed: 05/03/2020 Elsevier Patient Education  2022 Elsevier Inc.      Appointment Time: 12:30 Please arrive by 12:15 Appointment Date:07/10/2021  Location: [facility]   Prostate Biopsy Instructions  Stop all aspirin or blood thinners (aspirin, plavix, coumadin, warfarin, motrin, ibuprofen, advil, aleve, naproxen, naprosyn) for 7 days prior to the procedure.  If you have any questions about stopping these medications, please contact your primary care physician or cardiologist.  Having a light meal prior to the procedure is recommended.  If you are diabetic or have low blood sugar please bring a small snack or glucose tablet.  A Fleets enema is needed to be purchased over the counter at a local pharmacy and used 2 hours before you scheduled appointment.  This can be purchased over the counter at any pharmacy.  Antibiotics will be administered in the clinic at the time of the procedure and 1 tablet has been sent to your pharmacy. Please take the antibiotic as prescribed.    Please bring someone with you to the procedure to drive you home if you are given a valium to take prior to your procedure.   If you have any questions or concerns, please feel free to call the office at [phone] or send a Mychart message.    Thank you, [facility]

## 2021-07-10 ENCOUNTER — Other Ambulatory Visit: Payer: Self-pay | Admitting: Urology

## 2021-07-10 ENCOUNTER — Other Ambulatory Visit: Payer: Self-pay

## 2021-07-10 ENCOUNTER — Ambulatory Visit (HOSPITAL_COMMUNITY)
Admission: RE | Admit: 2021-07-10 | Discharge: 2021-07-10 | Disposition: A | Discharge status: Home / Self Care | Payer: BLUE CROSS/BLUE SHIELD | Source: Ambulatory Visit | Attending: Urology | Admitting: Urology

## 2021-07-10 ENCOUNTER — Ambulatory Visit (INDEPENDENT_AMBULATORY_CARE_PROVIDER_SITE_OTHER): Payer: BLUE CROSS/BLUE SHIELD | Admitting: Urology

## 2021-07-10 ENCOUNTER — Encounter (HOSPITAL_COMMUNITY): Payer: Self-pay

## 2021-07-10 ENCOUNTER — Encounter: Payer: Self-pay | Admitting: Urology

## 2021-07-10 DIAGNOSIS — N4232 Atypical small acinar proliferation of prostate: Secondary | ICD-10-CM | POA: Insufficient documentation

## 2021-07-10 DIAGNOSIS — R972 Elevated prostate specific antigen [PSA]: Secondary | ICD-10-CM

## 2021-07-10 MED ORDER — CEFTRIAXONE SODIUM 1 G IJ SOLR
INTRAMUSCULAR | Status: AC
  Filled 2021-07-10: qty 10

## 2021-07-10 MED ORDER — GENTAMICIN SULFATE 40 MG/ML IJ SOLN: 80.0000 mg | Freq: Once | INTRAMUSCULAR | Status: AC

## 2021-07-10 MED ORDER — LIDOCAINE HCL (PF) 2 % IJ SOLN
INTRAMUSCULAR | Status: AC
  Administered 2021-07-10: 10 mL
  Filled 2021-07-10: qty 10

## 2021-07-10 MED ORDER — GENTAMICIN SULFATE 40 MG/ML IJ SOLN
INTRAMUSCULAR | Status: AC
  Administered 2021-07-10: 80 mg via INTRAMUSCULAR
  Filled 2021-07-10: qty 2

## 2021-07-10 MED ORDER — LIDOCAINE HCL (PF) 2 % IJ SOLN: 10.0000 mL | Freq: Once | INTRAMUSCULAR | Status: AC

## 2021-07-10 MED ORDER — LIDOCAINE HCL (PF) 1 % IJ SOLN
INTRAMUSCULAR | Status: AC
  Filled 2021-07-10: qty 5

## 2021-07-10 NOTE — Progress Notes (Signed)
Prostate Biopsy Procedure   Informed consent was obtained after discussing risks/benefits of the procedure.  A time out was performed to ensure correct patient identity.  Pre-Procedure: - Last PSA Level: No results found for: "PSA" - Gentamicin given prophylactically - Levaquin 500 mg administered PO -Transrectal Ultrasound performed revealing a 59.9 gm prostate -No significant hypoechoic or median lobe noted  Procedure: - Prostate block performed using 10 cc 1% lidocaine and biopsies taken from sextant areas, a total of 12 under ultrasound guidance.  Post-Procedure: - Patient tolerated the procedure well - He was counseled to seek immediate medical attention if experiences any severe pain, significant bleeding, or fevers - Return in one week to discuss biopsy results  

## 2021-07-10 NOTE — Progress Notes (Signed)
Prostate procedure completed at this time and antibiotic injection tolerated well. PT verbalized understanding of discharge instructions and maintained vital signs WNL at completion of procedure. Labs collected and sent to lab for processing by ultrasound tech. PT ambulatory at discharge and reminded of follow up appointment with Dr. Ronne Binning on 07/17/21 at 3:45PM.

## 2021-07-10 NOTE — Patient Instructions (Signed)
Transrectal Ultrasound-Guided Prostate Biopsy, Care After °What can I expect after the procedure? °After the procedure, it is common to have: °Pain and discomfort near your butt (rectum), especially while sitting. °Pink-colored pee (urine). This is due to small amounts of blood in your pee. °A burning feeling while peeing. °Blood in your poop (stool). °Bleeding from your butt. °Blood in your semen. °Follow these instructions at home: °Medicines °Take over-the-counter and prescription medicines only as told by your doctor. °If you were given a sedative during your procedure, do not drive or use machines until your doctor says that it is safe. A sedative is a medicine that helps you relax. °If you were prescribed an antibiotic medicine, take it as told by your doctor. Do not stop taking it even if you start to feel better. °Activity ° °Return to your normal activities when your doctor says that it is safe. °Ask your doctor when it is okay for you to have sex. °You may have to avoid lifting. Ask your doctor how much you can safely lift. °General instructions ° °Drink enough water to keep your pee pale yellow. °Watch your pee, poop, and semen for new bleeding or bleeding that gets worse. °Keep all follow-up visits. °Contact a doctor if: °You have any of these: °Blood clots in your pee or poop. °Blood in your pee more than 2 weeks after the procedure. °Blood in your semen more than 2 months after the procedure. °New or worse bleeding in your pee, poop, or semen. °Very bad belly pain. °Your pee smells bad or unusual. °You have trouble peeing. °Your lower belly feels firm. °You have problems getting an erection. °You feel like you may vomit (are nauseous), or you vomit. °Get help right away if: °You have a fever or chills. °You have bright red pee. °You have very bad pain that does not get better with medicine. °You cannot pee. °Summary °After this procedure, it is common to have pain and discomfort near your butt,  especially while sitting. °You may have blood in your pee and poop. °It is common to have blood in your semen. °Get help right away if you have a fever or chills. °This information is not intended to replace advice given to you by your health care provider. Make sure you discuss any questions you have with your health care provider. °Document Revised: 02/11/2021 Document Reviewed: 02/11/2021 °Elsevier Patient Education © 2022 [employer]. ° °

## 2021-07-17 ENCOUNTER — Ambulatory Visit: Payer: BLUE CROSS/BLUE SHIELD | Admitting: Urology

## 2021-08-26 ENCOUNTER — Other Ambulatory Visit: Payer: Self-pay | Admitting: Family Medicine

## 2021-08-26 DIAGNOSIS — I10 Essential (primary) hypertension: Secondary | ICD-10-CM

## 2021-09-25 ENCOUNTER — Encounter: Payer: Self-pay | Admitting: Family Medicine

## 2021-09-25 ENCOUNTER — Ambulatory Visit: Payer: BLUE CROSS/BLUE SHIELD | Admitting: Family Medicine

## 2021-09-25 VITALS — BP 143/94 | HR 76 | Temp 97.9°F | Ht 69.0 in | Wt 217.6 lb

## 2021-09-25 DIAGNOSIS — I10 Essential (primary) hypertension: Secondary | ICD-10-CM

## 2021-09-25 DIAGNOSIS — R972 Elevated prostate specific antigen [PSA]: Secondary | ICD-10-CM | POA: Diagnosis not present

## 2021-09-25 DIAGNOSIS — Z9889 Other specified postprocedural states: Secondary | ICD-10-CM | POA: Diagnosis not present

## 2021-09-25 NOTE — Progress Notes (Signed)
Subjective: CC:HTN PCP: Raliegh Ip, DO BZM:CEYEMV Patrick Lambert is a 61 y.o. male presenting to clinic today for:  1. HTN/ elevated PSA Blood pressure noted to be elevated at last visit with his urologist in October.  He had persistent elevation in his PSA that was climbing so in November they proceeded with ultrasound-guided biopsy of the prostate which was negative for any evidence of prostate cancer.  He is told compliant with his blood pressure medicine.  No chest pain, shortness of breath, edema.  He has been very physically active and trying to do a new workout called the nomad.  He is really enjoying this and has put on a little weight which he thinks is muscle weight because his clothes are fitting looser.  For trying to his elevated prostate level, he is considering switching urologist back to the 1 in [place].  Overall he seems to be stable from that standpoint.   ROS: Per HPI  Allergies  Allergen Reactions   Bee Venom Anaphylaxis   Penicillins Hives, Shortness Of Breath and Itching    Did it involve swelling of the face/tongue/throat, SOB, or low BP? No Did it involve sudden or severe rash/hives, skin peeling, or any reaction on the inside of your mouth or nose? Yes Did you need to seek medical attention at a hospital or doctor's office? No When did it last happen?      Unknown If all above answers are "NO", may proceed with cephalosporin use.    Shellfish Allergy Anaphylaxis    Patient states it is all seafood.   Levaquin [Levofloxacin] Hives   Past Medical History:  Diagnosis Date   Family history of prostate cancer    Father   Hypertension    Vertigo     Current Outpatient Medications:    amLODipine (NORVASC) 5 MG tablet, TAKE 1 TABLET BY MOUTH EVERY DAY, Disp: 90 tablet, Rfl: 0 Social History   Socioeconomic History   Marital status: Married    Spouse name: Steward Drone    Number of children: 1   Years of education: Not on file   Highest education level:  Not on file  Occupational History   Occupation: retired   Tobacco Use   Smoking status: Never   Smokeless tobacco: Never  Vaping Use   Vaping Use: Never used  Substance and Sexual Activity   Alcohol use: Never   Drug use: Never   Sexual activity: Not on file  Other Topics Concern   Not on file  Social History Narrative   Health visitor. Previously a Corporate treasurer.   Social Determinants of Health   Financial Resource Strain: Not on file  Food Insecurity: Not on file  Transportation Needs: Not on file  Physical Activity: Not on file  Stress: Not on file  Social Connections: Not on file  Intimate Partner Violence: Not on file   Family History  Problem Relation Age of Onset   Breast cancer Mother    Lung cancer Mother    Prostate cancer Father    Stomach cancer Father    Cancer Maternal Grandfather        prostate     Objective: Office vital signs reviewed. BP (!) 143/94    Pulse 76    Temp 97.9 F (36.6 C)    Ht 5\' 9"  (1.753 m)    Wt 217 lb 9.6 oz (98.7 kg)    SpO2 98%    BMI 32.13 kg/m   Physical Examination:  General: Awake, alert, well nourished, No acute distress HEENT: Sclera white.  Moist mucous membranes Cardio: regular rate and rhythm, S1S2 heard, no murmurs appreciated Pulm: clear to auscultation bilaterally, no wheezes, rhonchi or rales; normal work of breathing on room air  Assessment/ Plan: 61 y.o. male   Primary hypertension  Elevated PSA  History of needle biopsy of prostate with negative result  Blood pressure is not at goal.  I would favor his blood pressure being less than 140/90.  I am going to reassess him in the next couple of months and if persistently above goal, plan to advance Norvasc to 10 mg daily.  Should continue following up with urology for monitoring of prostate levels.  Luckily, his recent prostate biopsy was negative for evidence of cancer  No orders of the defined types were placed in this  encounter.  No orders of the defined types were placed in this encounter.    Raliegh Ip, DO [facility] [phone]

## 2021-10-03 ENCOUNTER — Other Ambulatory Visit: Payer: BLUE CROSS/BLUE SHIELD

## 2021-10-03 DIAGNOSIS — R972 Elevated prostate specific antigen [PSA]: Secondary | ICD-10-CM

## 2021-10-04 LAB — PSA, TOTAL AND FREE
PSA, Free Pct: 26.5 %
PSA, Free: 1.46 ng/mL
Prostate Specific Ag, Serum: 5.5 ng/mL — ABNORMAL HIGH (ref 0.0–4.0)

## 2021-10-09 ENCOUNTER — Ambulatory Visit (INDEPENDENT_AMBULATORY_CARE_PROVIDER_SITE_OTHER): Payer: BLUE CROSS/BLUE SHIELD | Admitting: Urology

## 2021-10-09 ENCOUNTER — Other Ambulatory Visit: Payer: Self-pay

## 2021-10-09 ENCOUNTER — Encounter: Payer: Self-pay | Admitting: Urology

## 2021-10-09 VITALS — BP 149/93 | HR 77

## 2021-10-09 DIAGNOSIS — R972 Elevated prostate specific antigen [PSA]: Secondary | ICD-10-CM | POA: Diagnosis not present

## 2021-10-09 DIAGNOSIS — R351 Nocturia: Secondary | ICD-10-CM

## 2021-10-09 DIAGNOSIS — N400 Enlarged prostate without lower urinary tract symptoms: Secondary | ICD-10-CM | POA: Diagnosis not present

## 2021-10-09 NOTE — Progress Notes (Signed)
10/09/2021 2:16 PM   Patrick Lambert 11-Nov-1960 [id number]  Referring provider: Raliegh Ip, DO [address] [place],  [place] [address]  Followup elevated PSA  HPI: [name] is a 60yo here for followup for elevated PSA. Since last biopsy he has noticed an increase in his nocturia.  IPSS 2 QOL 2. Nocturia 2x. PSA 5.5 which is stable   PMH: Past Medical History:  Diagnosis Date   Family history of prostate cancer    Father   Hypertension    Vertigo     Surgical History: Past Surgical History:  Procedure Laterality Date   BIOPSY PROSTATE     Negative   HERNIA REPAIR  1998    Home Medications:  Allergies as of 10/09/2021       Reactions   Bee Venom Anaphylaxis   Penicillins Hives, Shortness Of Breath, Itching   Did it involve swelling of the face/tongue/throat, SOB, or low BP? No Did it involve sudden or severe rash/hives, skin peeling, or any reaction on the inside of your mouth or nose? Yes Did you need to seek medical attention at a hospital or doctor's office? No When did it last happen?      Unknown If all above answers are "NO", may proceed with cephalosporin use.   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   Levaquin [levofloxacin] Hives        Medication List        Accurate as of October 09, 2021  2:16 PM. If you have any questions, ask your nurse or doctor.          amLODipine 5 MG tablet Commonly known as: NORVASC TAKE 1 TABLET BY MOUTH EVERY DAY        Allergies:  Allergies  Allergen Reactions   Bee Venom Anaphylaxis   Penicillins Hives, Shortness Of Breath and Itching    Did it involve swelling of the face/tongue/throat, SOB, or low BP? No Did it involve sudden or severe rash/hives, skin peeling, or any reaction on the inside of your mouth or nose? Yes Did you need to seek medical attention at a hospital or doctor's office? No When did it last happen?      Unknown If all above answers are "NO", may  proceed with cephalosporin use.    Shellfish Allergy Anaphylaxis    Patient states it is all seafood.   Levaquin [Levofloxacin] Hives    Family History: Family History  Problem Relation Age of Onset   Breast cancer Mother    Lung cancer Mother    Prostate cancer Father    Stomach cancer Father    Cancer Maternal Grandfather        prostate     Social History:  reports that he has never smoked. He has never used smokeless tobacco. He reports that he does not drink alcohol and does not use drugs.  ROS: All other review of systems were reviewed and are negative except what is noted above in HPI  Physical Exam: BP (!) 149/93    Pulse 77   Constitutional:  Alert and oriented, No acute distress. HEENT: [place] AT, moist mucus membranes.  Trachea midline, no masses. Cardiovascular: No clubbing, cyanosis, or edema. Respiratory: Normal respiratory effort, no increased work of breathing. GI: Abdomen is soft, nontender, nondistended, no abdominal masses GU: No CVA tenderness.  Lymph: No cervical or inguinal lymphadenopathy. Skin: No rashes, bruises or suspicious lesions. Neurologic: Grossly intact, no focal deficits, moving  all 4 extremities. Psychiatric: Normal mood and affect.  Laboratory Data: Lab Results  Component Value Date   WBC 6.9 03/27/2021   HGB 15.8 03/27/2021   HCT 45.0 03/27/2021   MCV 89 03/27/2021   PLT 191 03/27/2021    Lab Results  Component Value Date   CREATININE 1.06 03/27/2021    No results found for: PSA  No results found for: TESTOSTERONE  No results found for: HGBA1C  Urinalysis    Component Value Date/Time   APPEARANCEUR Clear 06/12/2021 1653   GLUCOSEU Negative 06/12/2021 1653   BILIRUBINUR Negative 06/12/2021 1653   PROTEINUR Negative 06/12/2021 1653   UROBILINOGEN 0.2 10/07/2019 1334   NITRITE Negative 06/12/2021 1653   LEUKOCYTESUR Negative 06/12/2021 1653    Lab Results  Component Value Date   LABMICR See below: 06/12/2021   WBCUA  None seen 06/12/2021   LABEPIT None seen 06/12/2021   MUCUS Present 10/10/2020   BACTERIA None seen 06/12/2021    Pertinent Imaging:  No results found for this or any previous visit.  No results found for this or any previous visit.  No results found for this or any previous visit.  No results found for this or any previous visit.  No results found for this or any previous visit.  No results found for this or any previous visit.  No results found for this or any previous visit.  No results found for this or any previous visit.   Assessment & Plan:    Elevated PSA -RTC 6 months with PSA  No follow-ups on file.  Patrick Aye, MD  [facility]

## 2021-10-09 NOTE — Patient Instructions (Signed)
Prostate-Specific Antigen Test °Why am I having this test? °The prostate-specific antigen (PSA) test is a screening test for prostate cancer. It can identify early signs of prostate cancer, which may allow for early detection and more effective treatment. Your health care provider may recommend that you have a PSA test starting at age 62 or that you have one earlier if you are at higher risk for prostate cancer. You may also have a PSA test: °To monitor treatment of prostate cancer. °To check whether prostate cancer has returned after treatment. °What is being tested? °This test measures the amount of PSA in your blood. PSA is a protein that is made in the prostate. The prostate naturally produces more PSA as you age, but very high levels may be a sign of a medical condition. °What kind of sample is taken? °A blood sample is required for this test. It is usually collected by inserting a needle into a blood vessel but can also be collected by sticking a finger with a small needle. Blood for this test should be drawn before having an exam of the prostate that involves digital rectal examination to avoid affecting the results. °How do I prepare for this test? °Do not ejaculate starting 24 hours before your test, or as long as told by your health care provider, as this can cause an elevation in PSA. °Do not undergo any procedures that require manipulation of the prostate, such as biopsy or surgery, for 6 weeks before the test is done as this can cause an elevation in PSA. °Tell a health care provider about: °Any signs you may have of other conditions that can affect PSA levels, such as: °An enlarged prostate that is not caused by cancer (benign prostatic hyperplasia, or BPH). This condition is very common in older men. °A prostate or urinary tract infection. °Any allergies you have. °All medicines you are taking, including vitamins, herbs, eye drops, creams, and over-the-counter medicines. This also includes: °Medicines  to assist with hair growth, such as finasteride. °Any recent exposure to a medicine called diethylstilbestrol (DES). °Medicines such as male hormones (like testosterone) or other medicines that raise testosterone levels. °Any bleeding problems you have. °Any recent procedures you have had, especially any procedures involving the prostate or rectum. °Any medical conditions you have. °How are the results reported? °Your test results will be reported as a value that indicates how much PSA is in your blood. This will be given as nanograms of PSA per milliliter of blood (ng/mL). Your health care provider will compare your results to normal ranges that were established after testing a large group of people (reference ranges). Reference ranges may vary among labs and hospitals. °PSA levels vary from person to person and generally increase with age. Because of this variation, there is no single PSA value that is considered normal for everyone. Instead, PSA reference ranges are used to describe whether your PSA levels are considered low or high (elevated). Common reference ranges are: °Low: 0-2.5 ng/mL. °Slightly to moderately elevated: 2.6-10.0 ng/mL. °Moderately elevated: 10.0-19.9 ng/mL. °Significantly elevated: 20 ng/mL or greater. °What do the results mean? °A test result that is higher than 4 ng/mL may mean that you have prostate cancer. However, a PSA test by itself is not enough to diagnose prostate cancer. High PSA levels may also be caused by the natural aging process, prostate infection (prostatitis), or BPH. °PSA screening cannot tell you if your PSA is high due to cancer or a different cause. °A prostate biopsy   is the only way to diagnose prostate cancer. °A risk of having the PSA test is diagnosing and treating prostate cancer that would never have caused any symptoms or problems (overdiagnosis and overtreatment). °Talk with your health care provider about what your results mean. In some cases, your health care  provider may do more testing to confirm the results. °Questions to ask your health care provider °Ask your health care provider, or the department that is doing the test: °When will my results be ready? °How will I get my results? °What are my treatment options? °What other tests do I need? °What are my next steps? °Summary °The prostate-specific antigen (PSA) test is a screening test for prostate cancer. °Your health care provider may recommend that you have a PSA test starting at age 62 or that you have one earlier if you are at higher risk for prostate cancer. °A test result that is higher than 4 ng/mL may mean that you have prostate cancer. However, elevated levels can be caused by a number of conditions other than prostate cancer. °Talk with your health care provider about what your results mean. °This information is not intended to replace advice given to you by your health care provider. Make sure you discuss any questions you have with your health care provider. °Document Revised: 12/26/2020 Document Reviewed: 12/26/2020 °Elsevier Patient Education © 2022 [employer] ° °

## 2021-11-12 ENCOUNTER — Other Ambulatory Visit: Payer: Self-pay | Admitting: Family Medicine

## 2021-11-12 DIAGNOSIS — I10 Essential (primary) hypertension: Secondary | ICD-10-CM

## 2022-01-31 DIAGNOSIS — F330 Major depressive disorder, recurrent, mild: Secondary | ICD-10-CM | POA: Diagnosis not present

## 2022-02-06 DIAGNOSIS — F330 Major depressive disorder, recurrent, mild: Secondary | ICD-10-CM | POA: Diagnosis not present

## 2022-02-11 ENCOUNTER — Other Ambulatory Visit: Payer: Self-pay | Admitting: *Deleted

## 2022-02-11 NOTE — Telephone Encounter (Signed)
Closing encounter Fax request is for Amlod/Atorv 2.5-10 mg, is not on pt's list or in history Sending fax back with this information.

## 2022-02-12 DIAGNOSIS — F330 Major depressive disorder, recurrent, mild: Secondary | ICD-10-CM | POA: Diagnosis not present

## 2022-02-26 DIAGNOSIS — F330 Major depressive disorder, recurrent, mild: Secondary | ICD-10-CM | POA: Diagnosis not present

## 2022-03-12 DIAGNOSIS — F330 Major depressive disorder, recurrent, mild: Secondary | ICD-10-CM | POA: Diagnosis not present

## 2022-03-14 ENCOUNTER — Encounter: Payer: Self-pay | Admitting: Family Medicine

## 2022-03-14 DIAGNOSIS — I10 Essential (primary) hypertension: Secondary | ICD-10-CM

## 2022-03-14 MED ORDER — AMLODIPINE BESYLATE 5 MG PO TABS: 5.0000 mg | ORAL_TABLET | Freq: Every day | ORAL | 1 refills | Status: DC

## 2022-03-19 ENCOUNTER — Ambulatory Visit (INDEPENDENT_AMBULATORY_CARE_PROVIDER_SITE_OTHER): Payer: BLUE CROSS/BLUE SHIELD | Admitting: Family Medicine

## 2022-03-19 ENCOUNTER — Encounter: Payer: Self-pay | Admitting: Family Medicine

## 2022-03-19 ENCOUNTER — Encounter: Payer: BLUE CROSS/BLUE SHIELD | Admitting: Family Medicine

## 2022-03-19 VITALS — BP 140/90 | HR 73 | Temp 97.2°F | Ht 69.0 in | Wt 220.0 lb

## 2022-03-19 DIAGNOSIS — Z8042 Family history of malignant neoplasm of prostate: Secondary | ICD-10-CM | POA: Diagnosis not present

## 2022-03-19 DIAGNOSIS — E7800 Pure hypercholesterolemia, unspecified: Secondary | ICD-10-CM | POA: Diagnosis not present

## 2022-03-19 DIAGNOSIS — Z87892 Personal history of anaphylaxis: Secondary | ICD-10-CM

## 2022-03-19 DIAGNOSIS — R972 Elevated prostate specific antigen [PSA]: Secondary | ICD-10-CM | POA: Diagnosis not present

## 2022-03-19 DIAGNOSIS — Z23 Encounter for immunization: Secondary | ICD-10-CM

## 2022-03-19 DIAGNOSIS — Z0001 Encounter for general adult medical examination with abnormal findings: Secondary | ICD-10-CM

## 2022-03-19 DIAGNOSIS — I10 Essential (primary) hypertension: Secondary | ICD-10-CM

## 2022-03-19 DIAGNOSIS — Z7184 Encounter for health counseling related to travel: Secondary | ICD-10-CM | POA: Diagnosis not present

## 2022-03-19 DIAGNOSIS — Z0000 Encounter for general adult medical examination without abnormal findings: Secondary | ICD-10-CM

## 2022-03-19 DIAGNOSIS — Z0184 Encounter for antibody response examination: Secondary | ICD-10-CM

## 2022-03-19 MED ORDER — EPINEPHRINE 0.3 MG/0.3ML IJ SOAJ: 0.3000 mg | INTRAMUSCULAR | 0 refills | Status: DC | PRN

## 2022-03-19 NOTE — Progress Notes (Signed)
Patrick Lambert is a 60 y.o. male presents to office today for annual physical exam examination.    Concerns today include: 1.  Travel to [place] Patient will be traveling to [place] at the end of November.  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  He will also need any other vaccines in preparation for [place].  He is not totally sure about immunization history as he does not have his childhood vaccination history readily available.  However, he does believe he got all of his childhood vaccinations.  Occupation: Web designer, Marital status: married, Substance use: none Diet: Balanced, Exercise: Continues to exercise regularly Last colonoscopy: Up-to-date Refills needed today: EpiPen Immunizations needed: Immunization History  Administered Date(s) Administered   Influenza, Quadrivalent, Recombinant, Inj, Pf 06/09/2019   Moderna Sars-Covid-2 Vaccination 12/21/2019, 01/18/2020     Past Medical History:  Diagnosis Date   Family history of prostate cancer    Father   Hypertension    Vertigo    Social History   Socioeconomic History   Marital status: Married    Spouse name: Patrick Lambert    Number of children: 1   Years of education: Not on file   Highest education level: Not on file  Occupational History   Occupation: retired   Tobacco Use   Smoking status: Never   Smokeless tobacco: Never  Vaping Use   Vaping Use: Never used  Substance and Sexual Activity   Alcohol use: Never   Drug use: Never   Sexual activity: Not on file  Other Topics Concern   Not on file  Social History Narrative   Scientist, 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   Social Determinants of Health   Financial Resource Strain: Not on file  Food Insecurity: Not on file  Transportation Needs: Not on file  Physical Activity: Not on file  Stress: Not on file  Social Connections: Not on file  Intimate Partner  Violence: Not on file   Past Surgical History:  Procedure Laterality Date   BIOPSY PROSTATE     Negative   HERNIA REPAIR  1998   Family History  Problem Relation Age of Onset   Breast cancer Mother    Lung cancer Mother    Prostate cancer Father    Stomach cancer Father    Cancer Maternal Grandfather        prostate     Current Outpatient Medications:    amLODipine (NORVASC) 5 MG tablet, Take 1 tablet (5 mg total) by mouth daily., Disp: 90 tablet, Rfl: 1  Allergies  Allergen Reactions   Bee Venom Anaphylaxis   Penicillins Hives, Shortness Of Breath and Itching    Did it involve swelling of the face/tongue/throat, SOB, or low BP? No Did it involve sudden or severe rash/hives, skin peeling, or any reaction on the inside of your mouth or nose? Yes Did you need to seek medical attention at a hospital or doctor's office? No When did it last happen?      Unknown If all above answers are "NO", may proceed with cephalosporin use.    Shellfish Allergy Anaphylaxis    Patient states it is all seafood.   Levaquin [Levofloxacin] Hives     ROS: Review of Systems A comprehensive review of systems was negative.    Physical exam BP 140/90   Pulse 73   Temp (!) 97.2 F (36.2 C)   Ht 5' 9"  (1.753 m)  Wt 220 lb (99.8 kg)   SpO2 99%   BMI 32.49 kg/m  General appearance: alert, cooperative, appears stated age, no distress, and mildly obese Head: Normocephalic, without obvious abnormality, atraumatic Eyes: negative findings: lids and lashes normal, conjunctivae and sclerae normal, corneas clear, and pupils equal, round, reactive to light and accomodation Ears: normal TM's and external ear canals both ears Nose: Nares normal. Septum midline. Mucosa normal. No drainage or sinus tenderness. Throat: lips, mucosa, and tongue normal; teeth and gums normal Neck: no adenopathy, no carotid bruit, supple, symmetrical, trachea midline, and thyroid not enlarged, symmetric, no  tenderness/mass/nodules Back: symmetric, no curvature. ROM normal. No CVA tenderness. Lungs: clear to auscultation bilaterally Chest wall: no tenderness Heart: regular rate and rhythm, S1, S2 normal, no murmur, click, rub or gallop Abdomen:  Protuberant but soft and nontender.  No hepatosplenomegaly palpable Extremities: extremities normal, atraumatic, no cyanosis or edema Pulses: 2+ and symmetric Skin: Skin color, texture, turgor normal. No rashes or lesions Lymph nodes: Cervical, supraclavicular, and axillary nodes normal. Neurologic: Grossly normal Psych: Mood stable, speech normal, affect appropriate.  Very pleasant, interactive.     03/19/2022    9:29 AM 09/25/2021   10:09 AM 03/25/2021   11:04 AM  Depression screen PHQ 2/9  Decreased Interest 0 0 0  Down, Depressed, Hopeless 0 0 0  PHQ - 2 Score 0 0 0      03/19/2022    9:29 AM 09/25/2021   10:09 AM 03/25/2021   11:04 AM  GAD 7 : Generalized Anxiety Score  Nervous, Anxious, on Edge 0 0 0  Control/stop worrying 0 0 0  Worry too much - different things 0 0 0  Trouble relaxing 0 0 0  Restless 0 0 0  Easily annoyed or irritable 0 0 0  Afraid - awful might happen 0 0 0  Total GAD 7 Score 0 0 0  Anxiety Difficulty Not difficult at all Not difficult at all Not difficult at all    Assessment/ Plan: Patrick Lambert here for annual physical exam.   Annual physical exam  Essential hypertension - Plan: CMP14+EGFR  Pure hypercholesterolemia - Plan: CMP14+EGFR, Lipid Panel, TSH  Family history of prostate cancer - Plan: CBC  Travel advice encounter - Plan: Hepatitis B surface antibody,quantitative, Hepatitis A antibody, total, Measles/Mumps/Rubella Immunity  Immunity status testing - Plan: Hepatitis B surface antibody,quantitative, Hepatitis A antibody, total, Measles/Mumps/Rubella Immunity  Elevated PSA - Plan: PSA, total and free, PSA  History of anaphylaxis - Plan: EPINEPHrine 0.3 mg/0.3 mL IJ SOAJ injection  Blood  pressure is borderline but still within acceptable range upon recheck Check CMP, lipid panel, TSH and CBC.  We will also check hepatitis B, A and MMR titers in preparation for his upcoming trip.  He will need typhoid vaccine and I think that we will probably have to send him to the travel clinic in [place] to get this obtained at least 1 month prior to travel.  We will give him more information about this  PSA was collected and will be sent to his urologist in preparation for upcoming appointment  Epinephrine has been sent to mail order pharmacy  Nylen Creque M. Lajuana Ripple, DO

## 2022-03-19 NOTE — Patient Instructions (Signed)
Travel Vaccine Information Vaccines (immunizations) can protect you from certain diseases. If you plan to travel to another country, see your health care provider or a travel medicine specialist to discuss: Where you are going. Include all countries in your travel schedule. How long you are staying. What you will be doing. Based on this information, your health care provider may recommend: Routine vaccines. These vaccines are standard for all children and adults. Travel vaccines: For most travelers. These vaccines are recommended for most travelers before foreign travel. For some travelers. These vaccines may be necessary based on the destination country or region. It is important to see your health care provider at least 4-6 weeks before you travel, and bring your vaccine records. This allows time for recommended vaccines to take effect. It also provides enough time for you to get vaccines that must be given in a series over a period of days or weeks. If you have fewer than 4 weeks before you leave, you should still see your health care provider. You might still benefit from vaccines or medicines. What are routine vaccines? Routine vaccines are shots that can protect you from common diseases in many parts of the world. Most routine vaccines are given at certain ages starting in childhood. It is important that you are up to date on your routine vaccines before you travel. Routine vaccines include: An annual flu (influenza) vaccine. The annual influenza vaccine sometimes differs for the northern and southern hemispheres. You should: Get both vaccines if you are traveling to the other hemisphere and you have a chronic medical condition. Get the vaccine shortly before or during the flu season, and only if the vaccine in your country differs from the vaccine in your destination country. Get the other influenza vaccine either before leaving the country or shortly after arriving at the destination  country. Age-related vaccines. Infants 6-11 months old should receive a measles, mumps, and rubella (MMR) vaccine before traveling to another country. Children and adults should be up to date with all recommended vaccines. Adults 60 years or older should talk to their health care provider about getting a vaccine against a certain type of pneumonia (pneumococcal) and a vaccine against shingles (herpes zoster). Extra doses of certain vaccines (booster vaccines), such as Tdap (tetanus, diphtheria, and pertussis). What are recommended vaccines? Recommended travel vaccines change over time. Your health care provider can tell you what vaccines are recommended before your trip. Recommended vaccines will depend on: The country or countries of travel. Whether you will be traveling to rural areas. How long you will be traveling. The season of the year. Your health status. Your vaccine history. For most travelers The following vaccines are recommended for most international travelers, depending on the country or countries you are traveling to: Hepatitis A. Typhoid. For some travelers Additional vaccines may be required when traveling to certain countries, due to a disease being common in a particular area or an ongoing outbreak of a disease. The following vaccines may be recommended based on where you are traveling: Yellow fever vaccine. This is required before traveling to certain countries in [place] and [place]. Get the yellow fever vaccine at an approved center at least 10 days before your trip. You will receive a stamp, certificate, or other proof of yellow fever immunization. If proof of immunization is incomplete or inaccurate, you could be medically isolated (quarantined), denied entry, or given another dose of vaccine at the travel site. If it has been longer than 10 years since you   received the yellow fever vaccine, another dose is required. Meningococcal vaccine. This may be required  prior to travel to parts of [place] and [place]. Get this vaccine at least 10 days before your trip. After 10 days, most people show immunity to meningococcal disease. Proof of meningococcal immunization is required by the [place] for any person taking part in a Muslim pilgrimage. You may not receive a visa if you are not able to provide proof of immunization. If it has been longer than 3 years since your last immunization, another dose may be required. Polio vaccine. If you travel to a country where there is a higher risk of getting polio, you may need a booster dose. Polio is a routine vaccine that most people receive as a child. Even if you completed the vaccine series as a child, you may need a booster dose before traveling to high-risk countries. Infants and children may need to follow an accelerated schedule to complete the polio vaccine series before traveling to high-risk countries. Some countries may require you to show proof that you have been vaccinated. Depending on your travel plans, you may need additional vaccines, such as: Hepatitis B. Rabies. Tick-borne encephalitis. Cholera. Where to find more information Centers for Disease Control and Prevention (CDC): http://www.wolf.info/ World Health Organization [facility]): RoleLink.com.br U.S. Department of Health and Human Services: www.vaccines.gov Summary Vaccines (immunizations) can protect you from certain diseases. See your health care provider at least 4-6 weeks before you travel, and bring your vaccine records. This allows time for vaccines to take effect. Vaccines for travelers include routine vaccines, recommended travel vaccines, and geographically required travel vaccines. The most commonly recommended travel vaccines are the hepatitis A and typhoid vaccines. This information is not intended to replace advice given to you by your health care provider. Make sure you discuss any questions you have with your health  care provider. Document Revised: 10/08/2020 Document Reviewed: 10/08/2020 Elsevier Patient Education  [place].

## 2022-03-20 LAB — CMP14+EGFR
ALT: 15 IU/L (ref 0–44)
AST: 21 IU/L (ref 0–40)
Albumin/Globulin Ratio: 2 (ref 1.2–2.2)
Albumin: 4.5 g/dL (ref 3.9–4.9)
Alkaline Phosphatase: 72 IU/L (ref 44–121)
BUN/Creatinine Ratio: 11 (ref 10–24)
BUN: 14 mg/dL (ref 8–27)
Bilirubin Total: 0.6 mg/dL (ref 0.0–1.2)
CO2: 26 mmol/L (ref 20–29)
Calcium: 9.4 mg/dL (ref 8.6–10.2)
Chloride: 103 mmol/L (ref 96–106)
Creatinine, Ser: 1.29 mg/dL — ABNORMAL HIGH (ref 0.76–1.27)
Globulin, Total: 2.3 g/dL (ref 1.5–4.5)
Glucose: 87 mg/dL (ref 70–99)
Potassium: 4.2 mmol/L (ref 3.5–5.2)
Sodium: 140 mmol/L (ref 134–144)
Total Protein: 6.8 g/dL (ref 6.0–8.5)
eGFR: 63 mL/min/{1.73_m2} (ref >59)

## 2022-03-20 LAB — PSA: Prostate Specific Ag, Serum: 6.1 ng/mL — ABNORMAL HIGH (ref 0.0–4.0)

## 2022-03-20 LAB — CBC
Hematocrit: 47.8 % (ref 37.5–51.0)
Hemoglobin: 16.4 g/dL (ref 13.0–17.7)
MCH: 30.7 pg (ref 26.6–33.0)
MCHC: 34.3 g/dL (ref 31.5–35.7)
MCV: 90 fL (ref 79–97)
Platelets: 200 10*3/uL (ref 150–450)
RBC: 5.34 x10E6/uL (ref 4.14–5.80)
RDW: 13.3 % (ref 11.6–15.4)
WBC: 5.6 10*3/uL (ref 3.4–10.8)

## 2022-03-20 LAB — HEPATITIS B SURFACE ANTIBODY, QUANTITATIVE: Hepatitis B Surf Ab Quant: <3.1 m[IU]/mL — ABNORMAL LOW (ref >9.9)

## 2022-03-20 LAB — MEASLES/MUMPS/RUBELLA IMMUNITY
MUMPS ABS, IGG: >300 AU/mL (ref >10.9)
RUBEOLA AB, IGG: >300 AU/mL (ref >16.4)
Rubella Antibodies, IGG: <0.9 index — ABNORMAL LOW (ref >0.99)

## 2022-03-20 LAB — LIPID PANEL
Chol/HDL Ratio: 3.7 ratio (ref 0.0–5.0)
Cholesterol, Total: 181 mg/dL (ref 100–199)
HDL: 49 mg/dL (ref >39)
LDL Chol Calc (NIH): 115 mg/dL — ABNORMAL HIGH (ref 0–99)
Triglycerides: 95 mg/dL (ref 0–149)
VLDL Cholesterol Cal: 17 mg/dL (ref 5–40)

## 2022-03-20 LAB — PSA, TOTAL AND FREE
PSA, Free Pct: 22 %
PSA, Free: 1.32 ng/mL
Prostate Specific Ag, Serum: 6 ng/mL — ABNORMAL HIGH (ref 0.0–4.0)

## 2022-03-20 LAB — TSH: TSH: 1.43 u[IU]/mL (ref 0.450–4.500)

## 2022-03-20 LAB — HEPATITIS A ANTIBODY, TOTAL: hep A Total Ab: NEGATIVE

## 2022-03-20 NOTE — Progress Notes (Signed)
LM informing patient with Contact information

## 2022-04-04 ENCOUNTER — Ambulatory Visit: Payer: BLUE CROSS/BLUE SHIELD | Admitting: Urology

## 2022-04-04 ENCOUNTER — Encounter: Payer: Self-pay | Admitting: Urology

## 2022-04-04 VITALS — BP 167/78 | HR 87

## 2022-04-04 DIAGNOSIS — R972 Elevated prostate specific antigen [PSA]: Secondary | ICD-10-CM

## 2022-04-04 LAB — URINALYSIS, ROUTINE W REFLEX MICROSCOPIC
Bilirubin, UA: NEGATIVE
Glucose, UA: NEGATIVE
Leukocytes,UA: NEGATIVE
Nitrite, UA: NEGATIVE
RBC, UA: NEGATIVE
Specific Gravity, UA: 1.025 (ref 1.005–1.030)
Urobilinogen, Ur: 0.2 mg/dL (ref 0.2–1.0)
pH, UA: 5.5 (ref 5.0–7.5)

## 2022-04-04 LAB — MICROSCOPIC EXAMINATION
Bacteria, UA: NONE SEEN
Epithelial Cells (non renal): NONE SEEN /hpf (ref 0–10)
RBC, Urine: NONE SEEN /hpf (ref 0–2)
Renal Epithel, UA: NONE SEEN /hpf
WBC, UA: NONE SEEN /hpf (ref 0–5)

## 2022-04-04 NOTE — Progress Notes (Signed)
04/04/2022 11:44 AM   Patrick Lambert 1961-01-27 [id number]  Referring provider: Raliegh Ip, Lambert [address] [place],  [place] [address]  Elevated PSA   HPI: Patrick Lambert is a 61yo here for followup for elevated PSA. PSA stable at 6.1. IPSS 3 QOL 0.  No other complaints today   PMH: Past Medical History:  Diagnosis Date   Family history of prostate cancer    Father   Hypertension    Vertigo     Surgical History: Past Surgical History:  Procedure Laterality Date   BIOPSY PROSTATE     Negative   HERNIA REPAIR  1998    Home Medications:  Allergies as of 04/04/2022       Reactions   Bee Venom Anaphylaxis   Penicillins Hives, Shortness Of Breath, Itching   Did it involve swelling of the face/tongue/throat, SOB, or low BP? No Did it involve sudden or severe rash/hives, skin peeling, or any reaction on the inside of your mouth or nose? Yes Did you need to seek medical attention at a hospital or doctor's office? No When did it last happen?      Unknown If all above answers are "NO", may proceed with cephalosporin use.   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   Levaquin [levofloxacin] Hives        Medication List        Accurate as of April 04, 2022 11:44 AM. If you have any questions, ask your nurse or doctor.          amLODipine 5 MG tablet Commonly known as: NORVASC Take 1 tablet (5 mg total) by mouth daily.   EPINEPHrine 0.3 mg/0.3 mL Soaj injection Commonly known as: EPI-PEN Inject 0.3 mg into the muscle as needed for anaphylaxis.        Allergies:  Allergies  Allergen Reactions   Bee Venom Anaphylaxis   Penicillins Hives, Shortness Of Breath and Itching    Did it involve swelling of the face/tongue/throat, SOB, or low BP? No Did it involve sudden or severe rash/hives, skin peeling, or any reaction on the inside of your mouth or nose? Yes Did you need to seek medical attention at a hospital or doctor's office?  No When did it last happen?      Unknown If all above answers are "NO", may proceed with cephalosporin use.    Shellfish Allergy Anaphylaxis    Patient states it is all seafood.   Levaquin [Levofloxacin] Hives    Family History: Family History  Problem Relation Age of Onset   Breast cancer Mother    Lung cancer Mother    Prostate cancer Father    Stomach cancer Father    Cancer Maternal Grandfather        prostate     Social History:  reports that he has never smoked. He has never used smokeless tobacco. He reports that he does not drink alcohol and does not use drugs.  ROS: All other review of systems were reviewed and are negative except what is noted above in HPI  Physical Exam: BP (!) 167/78   Pulse 87   Constitutional:  Alert and oriented, No acute distress. HEENT: [place] AT, moist mucus membranes.  Trachea midline, no masses. Cardiovascular: No clubbing, cyanosis, or edema. Respiratory: Normal respiratory effort, no increased work of breathing. GI: Abdomen is soft, nontender, nondistended, no abdominal masses GU: No CVA tenderness.  Lymph: No cervical or inguinal lymphadenopathy. Skin: No  rashes, bruises or suspicious lesions. Neurologic: Grossly intact, no focal deficits, moving all 4 extremities. Psychiatric: Normal mood and affect.  Laboratory Data: Lab Results  Component Value Date   WBC 5.6 03/19/2022   HGB 16.4 03/19/2022   HCT 47.8 03/19/2022   MCV 90 03/19/2022   PLT 200 03/19/2022    Lab Results  Component Value Date   CREATININE 1.29 (H) 03/19/2022    No results found for: "PSA"  No results found for: "TESTOSTERONE"  No results found for: "HGBA1C"  Urinalysis    Component Value Date/Time   APPEARANCEUR Clear 06/12/2021 1653   GLUCOSEU Negative 06/12/2021 1653   BILIRUBINUR Negative 06/12/2021 1653   PROTEINUR Negative 06/12/2021 1653   UROBILINOGEN 0.2 10/07/2019 1334   NITRITE Negative 06/12/2021 1653   LEUKOCYTESUR Negative 06/12/2021  1653    Lab Results  Component Value Date   LABMICR See below: 06/12/2021   WBCUA None seen 06/12/2021   LABEPIT None seen 06/12/2021   MUCUS Present 10/10/2020   BACTERIA None seen 06/12/2021    Pertinent Imaging:  No results found for this or any previous visit.  No results found for this or any previous visit.  No results found for this or any previous visit.  No results found for this or any previous visit.  No results found for this or any previous visit.  No results found for this or any previous visit.  No results found for this or any previous visit.  No results found for this or any previous visit.   Assessment & Plan:    1. Elevated PSA -RTC 6 months with PSA - Urinalysis, Routine w reflex microscopic   No follow-ups on file.  Wilkie Aye, MD  [facility] [place]

## 2022-04-04 NOTE — Patient Instructions (Signed)

## 2022-04-09 DIAGNOSIS — F330 Major depressive disorder, recurrent, mild: Secondary | ICD-10-CM | POA: Diagnosis not present

## 2022-04-11 ENCOUNTER — Ambulatory Visit: Payer: BLUE CROSS/BLUE SHIELD | Admitting: Urology

## 2022-05-22 ENCOUNTER — Telehealth: Payer: Self-pay | Admitting: Family Medicine

## 2022-05-22 NOTE — Telephone Encounter (Signed)
Lm making pt aware able to get vaccine at [facility] drug

## 2022-09-11 ENCOUNTER — Other Ambulatory Visit: Payer: Self-pay | Admitting: Family Medicine

## 2022-09-11 DIAGNOSIS — I10 Essential (primary) hypertension: Secondary | ICD-10-CM

## 2022-09-19 ENCOUNTER — Encounter: Payer: Self-pay | Admitting: Family Medicine

## 2022-09-19 ENCOUNTER — Ambulatory Visit: Payer: BLUE CROSS/BLUE SHIELD | Admitting: Family Medicine

## 2022-09-19 VITALS — BP 140/86 | HR 78 | Temp 98.6°F | Ht 69.0 in | Wt 218.0 lb

## 2022-09-19 DIAGNOSIS — Z87892 Personal history of anaphylaxis: Secondary | ICD-10-CM | POA: Diagnosis not present

## 2022-09-19 DIAGNOSIS — R972 Elevated prostate specific antigen [PSA]: Secondary | ICD-10-CM | POA: Diagnosis not present

## 2022-09-19 DIAGNOSIS — E7800 Pure hypercholesterolemia, unspecified: Secondary | ICD-10-CM | POA: Diagnosis not present

## 2022-09-19 DIAGNOSIS — Z1159 Encounter for screening for other viral diseases: Secondary | ICD-10-CM | POA: Diagnosis not present

## 2022-09-19 DIAGNOSIS — I10 Essential (primary) hypertension: Secondary | ICD-10-CM

## 2022-09-19 MED ORDER — AMLODIPINE BESYLATE 5 MG PO TABS: 5.0000 mg | ORAL_TABLET | Freq: Every day | ORAL | 0 refills | Status: DC

## 2022-09-19 MED ORDER — EPINEPHRINE 0.3 MG/0.3ML IJ SOAJ: 0.3000 mg | INTRAMUSCULAR | 0 refills | Status: DC | PRN

## 2022-09-22 ENCOUNTER — Other Ambulatory Visit: Payer: Self-pay | Admitting: Family Medicine

## 2022-09-22 DIAGNOSIS — E7800 Pure hypercholesterolemia, unspecified: Secondary | ICD-10-CM

## 2022-09-22 LAB — CMP14+EGFR
ALT: 24 IU/L (ref 0–44)
AST: 21 IU/L (ref 0–40)
Albumin/Globulin Ratio: 2 (ref 1.2–2.2)
Albumin: 4.5 g/dL (ref 3.9–4.9)
Alkaline Phosphatase: 70 IU/L (ref 44–121)
BUN/Creatinine Ratio: 14 (ref 10–24)
BUN: 18 mg/dL (ref 8–27)
Bilirubin Total: 0.6 mg/dL (ref 0.0–1.2)
CO2: 23 mmol/L (ref 20–29)
Calcium: 9.2 mg/dL (ref 8.6–10.2)
Chloride: 103 mmol/L (ref 96–106)
Creatinine, Ser: 1.27 mg/dL (ref 0.76–1.27)
Globulin, Total: 2.2 g/dL (ref 1.5–4.5)
Glucose: 92 mg/dL (ref 70–99)
Potassium: 3.8 mmol/L (ref 3.5–5.2)
Sodium: 143 mmol/L (ref 134–144)
Total Protein: 6.7 g/dL (ref 6.0–8.5)
eGFR: 64 mL/min/{1.73_m2} (ref >59)

## 2022-09-22 LAB — CBC WITH DIFFERENTIAL/PLATELET
Basophils Absolute: 0.1 10*3/uL (ref 0.0–0.2)
Basos: 1 %
EOS (ABSOLUTE): 0.2 10*3/uL (ref 0.0–0.4)
Eos: 3 %
Hematocrit: 48.1 % (ref 37.5–51.0)
Hemoglobin: 16.6 g/dL (ref 13.0–17.7)
Immature Grans (Abs): 0 10*3/uL (ref 0.0–0.1)
Immature Granulocytes: 0 %
Lymphocytes Absolute: 1.7 10*3/uL (ref 0.7–3.1)
Lymphs: 28 %
MCH: 30.9 pg (ref 26.6–33.0)
MCHC: 34.5 g/dL (ref 31.5–35.7)
MCV: 89 fL (ref 79–97)
Monocytes Absolute: 0.5 10*3/uL (ref 0.1–0.9)
Monocytes: 8 %
Neutrophils Absolute: 3.7 10*3/uL (ref 1.4–7.0)
Neutrophils: 60 %
Platelets: 197 10*3/uL (ref 150–450)
RBC: 5.38 x10E6/uL (ref 4.14–5.80)
RDW: 12.9 % (ref 11.6–15.4)
WBC: 6.2 10*3/uL (ref 3.4–10.8)

## 2022-09-22 LAB — LIPID PANEL
Chol/HDL Ratio: 3.7 ratio (ref 0.0–5.0)
Cholesterol, Total: 193 mg/dL (ref 100–199)
HDL: 52 mg/dL (ref >39)
LDL Chol Calc (NIH): 124 mg/dL — ABNORMAL HIGH (ref 0–99)
Triglycerides: 92 mg/dL (ref 0–149)
VLDL Cholesterol Cal: 17 mg/dL (ref 5–40)

## 2022-09-22 LAB — HEPATITIS C ANTIBODY: Hep C Virus Ab: NONREACTIVE

## 2022-09-22 NOTE — Progress Notes (Signed)
Subjective: CC: Hypertension PCP: Janora Norlander, DO NWG:NFAOZH Patrick Lambert is a 62 y.o. male presenting to clinic today for:  1.  Hypertension Patient reports compliance with Norvasc.  No reports of chest pain, shortness of breath, dizziness or edema.  Home blood pressures are running normal.  Continues to stay physically active   ROS: Per HPI  Allergies  Allergen Reactions   Bee Venom Anaphylaxis   Penicillins Hives, Shortness Of Breath and Itching    Did it involve swelling of the face/tongue/throat, SOB, or low BP? No Did it involve sudden or severe rash/hives, skin peeling, or any reaction on the inside of your mouth or nose? Yes Did you need to seek medical attention at a hospital or doctor's office? No When did it last happen?      Unknown If all above answers are "NO", may proceed with cephalosporin use.    Shellfish Allergy Anaphylaxis    Patient states it is all seafood.   Levaquin [Levofloxacin] Hives   Past Medical History:  Diagnosis Date   Family history of prostate cancer    Father   Hypertension    Vertigo     Current Outpatient Medications:    amLODipine (NORVASC) 5 MG tablet, Take 1 tablet (5 mg total) by mouth daily., Disp: 90 tablet, Rfl: 0   EPINEPHrine 0.3 mg/0.3 mL IJ SOAJ injection, Inject 0.3 mg into the muscle as needed for anaphylaxis., Disp: 3 each, Rfl: 0 Social History   Socioeconomic History   Marital status: Married    Spouse name: Hassan Rowan    Number of children: 1   Years of education: Not on file   Highest education level: Not on file  Occupational History   Occupation: retired   Tobacco Use   Smoking status: Never   Smokeless tobacco: Never  Vaping Use   Vaping Use: Never used  Substance and Sexual Activity   Alcohol use: Never   Drug use: Never   Sexual activity: Not on file  Other Topics Concern   Not on file  Social History Narrative   Scientist, 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   Social  Determinants of Health   Financial Resource Strain: Not on file  Food Insecurity: Not on file  Transportation Needs: Not on file  Physical Activity: Not on file  Stress: Not on file  Social Connections: Not on file  Intimate Partner Violence: Not on file   Family History  Problem Relation Age of Onset   Breast cancer Mother    Lung cancer Mother    Prostate cancer Father    Stomach cancer Father    Cancer Maternal Grandfather        prostate     Objective: Office vital signs reviewed. BP (!) 140/86 Comment: repeat  Pulse 78   Temp 98.6 F (37 C)   Ht 5\' 9"  (1.753 m)   Wt 218 lb (98.9 kg)   SpO2 96%   BMI 32.19 kg/m   Physical Examination:  General: Awake, alert, well nourished, No acute distress HEENT: sclera white, MMM Cardio: regular rate and rhythm, S1S2 heard, no murmurs appreciated Pulm: clear to auscultation bilaterally, no wheezes, rhonchi or rales; normal work of breathing on room air Extremities: warm, well perfused, No edema, cyanosis or clubbing; +2 pulses bilaterally    Assessment/ Plan: 62 y.o. male   Primary hypertension - Plan: CMP14+EGFR, amLODipine (NORVASC) 5 MG tablet  Pure hypercholesterolemia - Plan: CMP14+EGFR, Lipid panel  History of anaphylaxis - Plan: CBC  with Differential/Platelet, EPINEPHrine 0.3 mg/0.3 mL IJ SOAJ injection  Need for hepatitis C screening test - Plan: Hepatitis C Antibody  Blood pressure controlled but borderline upon recheck.  Check lipid, CMP  Not currently on statin  EpiPen renewed.  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  Orders Placed This Encounter  Procedures   CBC with Differential/Platelet   CMP14+EGFR   Lipid panel   Hepatitis C Antibody   Meds ordered this encounter  Medications   amLODipine (NORVASC) 5 MG tablet    Sig: Take 1 tablet (5 mg total) by mouth daily.    Dispense:  90 tablet    Refill:  0    EPINEPHrine 0.3 mg/0.3 mL IJ SOAJ injection    Sig: Inject 0.3 mg into the muscle as needed for anaphylaxis.    Dispense:  3 each    Refill:  [place], [place] [phone]

## 2022-09-24 LAB — PSA: Prostate Specific Ag, Serum: 5.4 ng/mL — ABNORMAL HIGH (ref 0.0–4.0)

## 2022-09-24 LAB — SPECIMEN STATUS REPORT

## 2022-10-03 ENCOUNTER — Ambulatory Visit: Payer: BLUE CROSS/BLUE SHIELD | Admitting: Urology

## 2022-10-03 ENCOUNTER — Encounter: Payer: Self-pay | Admitting: Urology

## 2022-10-03 VITALS — BP 158/88 | HR 76

## 2022-10-03 DIAGNOSIS — R972 Elevated prostate specific antigen [PSA]: Secondary | ICD-10-CM | POA: Diagnosis not present

## 2022-10-03 DIAGNOSIS — N400 Enlarged prostate without lower urinary tract symptoms: Secondary | ICD-10-CM | POA: Diagnosis not present

## 2022-10-03 DIAGNOSIS — R351 Nocturia: Secondary | ICD-10-CM

## 2022-10-03 NOTE — Progress Notes (Signed)
10/03/2022 10:48 AM   Norvel Richards 1961/05/25 [id number]  Referring provider: Janora Norlander, DO [place],  [place] [address]  Followup elevated PSA and BPH   HPI: Patrick Lambert is a 62yo here for followup for elevated PSA and BPH with nocturia.  IPSS 2 QOL 0. Nocturia 2x. Urine stream strong. No straining to urinate.    PMH: Past Medical History:  Diagnosis Date   Family history of prostate cancer    Father   Hypertension    Vertigo     Surgical History: Past Surgical History:  Procedure Laterality Date   BIOPSY PROSTATE     Negative   HERNIA REPAIR  1998    Home Medications:  Allergies as of 10/03/2022       Reactions   Bee Venom Anaphylaxis   Penicillins Hives, Shortness Of Breath, Itching   Did it involve swelling of the face/tongue/throat, SOB, or low BP? No Did it involve sudden or severe rash/hives, skin peeling, or any reaction on the inside of your mouth or nose? Yes Did you need to seek medical attention at a hospital or doctor's office? No When did it last happen?      Unknown If all above answers are "NO", may proceed with cephalosporin use.   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   Levaquin [levofloxacin] Hives        Medication List        Accurate as of October 03, 2022 10:48 AM. If you have any questions, ask your nurse or doctor.          amLODipine 5 MG tablet Commonly known as: NORVASC Take 1 tablet (5 mg total) by mouth daily.   EPINEPHrine 0.3 mg/0.3 mL Soaj injection Commonly known as: EPI-PEN Inject 0.3 mg into the muscle as needed for anaphylaxis.        Allergies:  Allergies  Allergen Reactions   Bee Venom Anaphylaxis   Penicillins Hives, Shortness Of Breath and Itching    Did it involve swelling of the face/tongue/throat, SOB, or low BP? No Did it involve sudden or severe rash/hives, skin peeling, or any reaction on the inside of your mouth or nose? Yes Did you need to  seek medical attention at a hospital or doctor's office? No When did it last happen?      Unknown If all above answers are "NO", may proceed with cephalosporin use.    Shellfish Allergy Anaphylaxis    Patient states it is all seafood.   Levaquin [Levofloxacin] Hives    Family History: Family History  Problem Relation Age of Onset   Breast cancer Mother    Lung cancer Mother    Prostate cancer Father    Stomach cancer Father    Cancer Maternal Grandfather        prostate     Social History:  reports that he has never smoked. He has never used smokeless tobacco. He reports that he does not drink alcohol and does not use drugs.  ROS: All other review of systems were reviewed and are negative except what is noted above in HPI  Physical Exam: BP (!) 158/88   Pulse 76   Constitutional:  Alert and oriented, No acute distress. HEENT: [place] AT, moist mucus membranes.  Trachea midline, no masses. Cardiovascular: No clubbing, cyanosis, or edema. Respiratory: Normal respiratory effort, no increased work of breathing. GI: Abdomen is soft, nontender, nondistended, no abdominal masses GU: No CVA tenderness.  Lymph: No cervical or inguinal lymphadenopathy. Skin: No rashes, bruises or suspicious lesions. Neurologic: Grossly intact, no focal deficits, moving all 4 extremities. Psychiatric: Normal mood and affect.  Laboratory Data: Lab Results  Component Value Date   WBC 6.2 09/19/2022   HGB 16.6 09/19/2022   HCT 48.1 09/19/2022   MCV 89 09/19/2022   PLT 197 09/19/2022    Lab Results  Component Value Date   CREATININE 1.27 09/19/2022    No results found for: "PSA"  No results found for: "TESTOSTERONE"  No results found for: "HGBA1C"  Urinalysis    Component Value Date/Time   APPEARANCEUR Clear 04/04/2022 1111   GLUCOSEU Negative 04/04/2022 1111   BILIRUBINUR Negative 04/04/2022 1111   PROTEINUR 1+ (A) 04/04/2022 1111   UROBILINOGEN 0.2 10/07/2019 1334   NITRITE Negative  04/04/2022 1111   LEUKOCYTESUR Negative 04/04/2022 1111    Lab Results  Component Value Date   LABMICR See below: 04/04/2022   WBCUA None seen 04/04/2022   LABEPIT None seen 04/04/2022   MUCUS Present 04/04/2022   BACTERIA None seen 04/04/2022    Pertinent Imaging:  No results found for this or any previous visit.  No results found for this or any previous visit.  No results found for this or any previous visit.  No results found for this or any previous visit.  No results found for this or any previous visit.  No valid procedures specified. No results found for this or any previous visit.  No results found for this or any previous visit.   Assessment & Plan:    1. Elevated PSA -Followup  - Urinalysis, Routine w reflex microscopic  2. BPH without urinary obstruction -patient defers therapy at this time  3. Nocturia -decrease fluid intake within 2 hours of bedtime   No follow-ups on file.  Nicolette Bang, MD  [facility] [place]

## 2022-10-03 NOTE — Patient Instructions (Signed)

## 2022-11-22 ENCOUNTER — Other Ambulatory Visit: Payer: Self-pay | Admitting: Family Medicine

## 2022-11-22 DIAGNOSIS — I10 Essential (primary) hypertension: Secondary | ICD-10-CM

## 2023-01-14 IMAGING — US US GUIDANCE NEEDLE PLACEMENT
1 series · 14 of 20 positions shown · non-contrast
Comparison: None.

CLINICAL DATA: Prostate biopsy.

EXAM:
IR ULTRASOUND GUIDED ASPIRATION/DRAINAGE
TECHNIQUE: Ultrasound guidance was provided for prostate biopsy.

[Series 1: us guidance needle placement · 0.11mm/px · 14 of 20 slices shown]
[im 1/20]
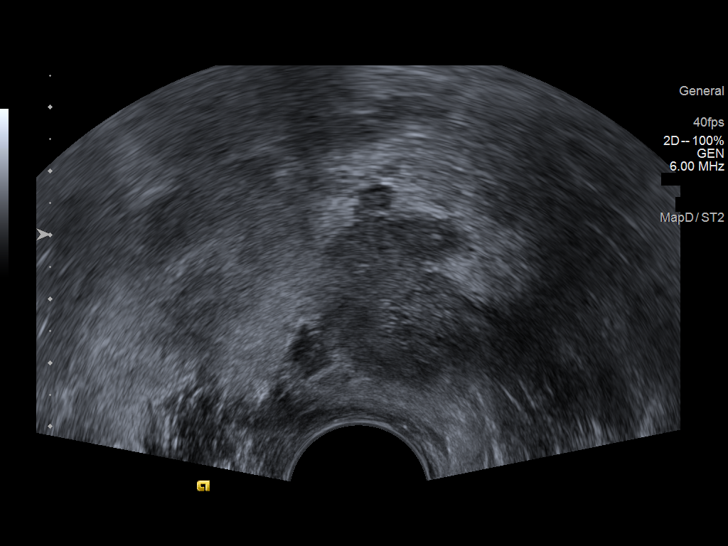
[im 3/20]
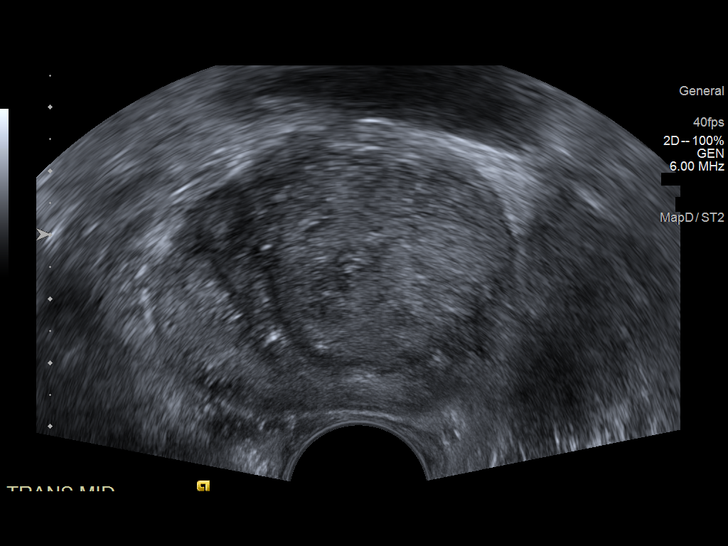
[im 4/20]
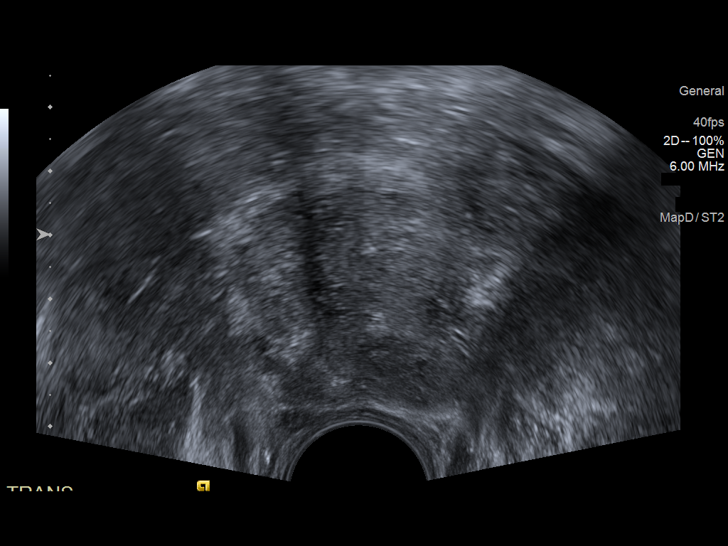
[im 6/20]
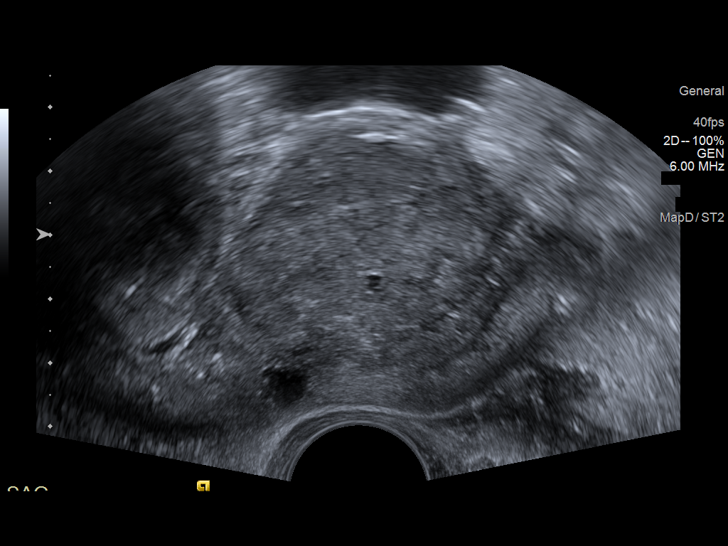
[im 7/20]
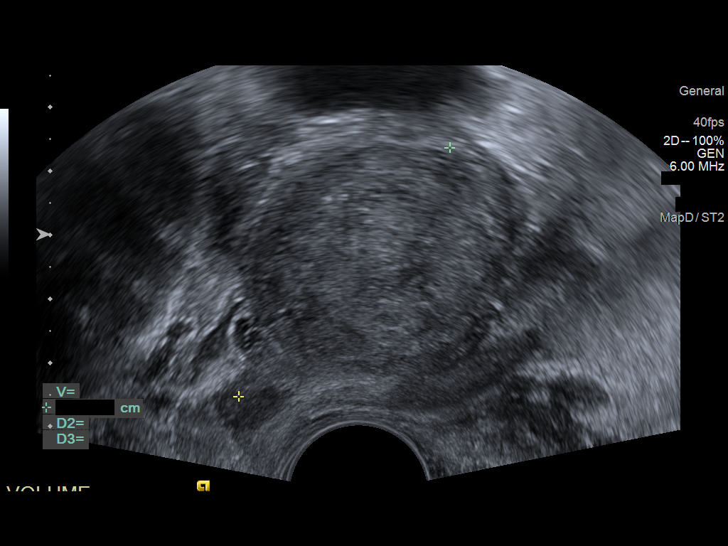
[im 8/20]
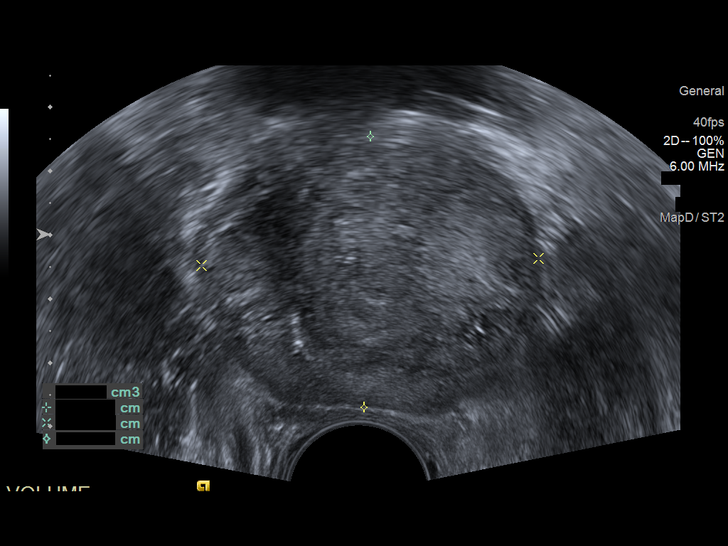
[im 10/20]
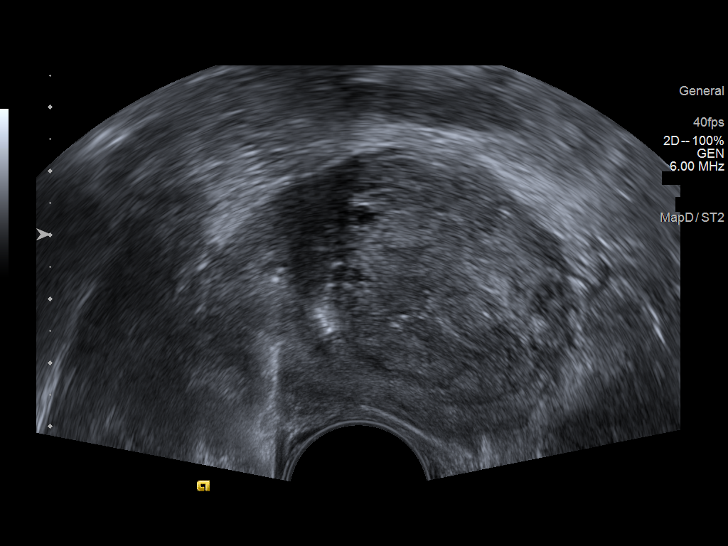
[im 11/20]
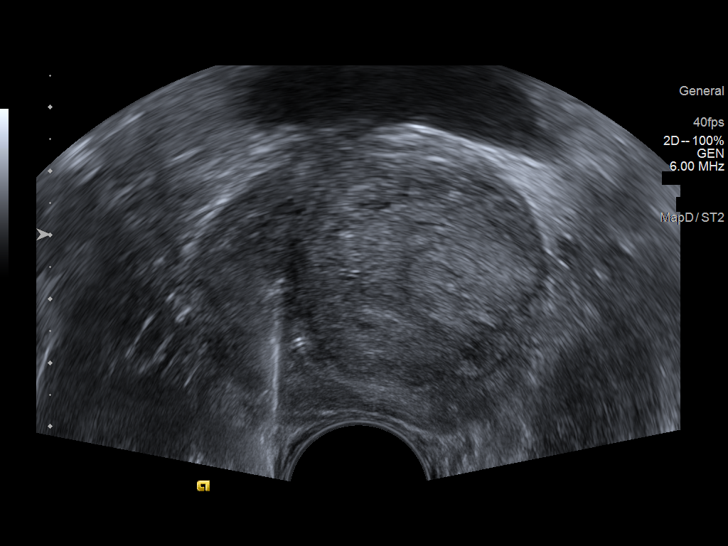
[im 13/20]
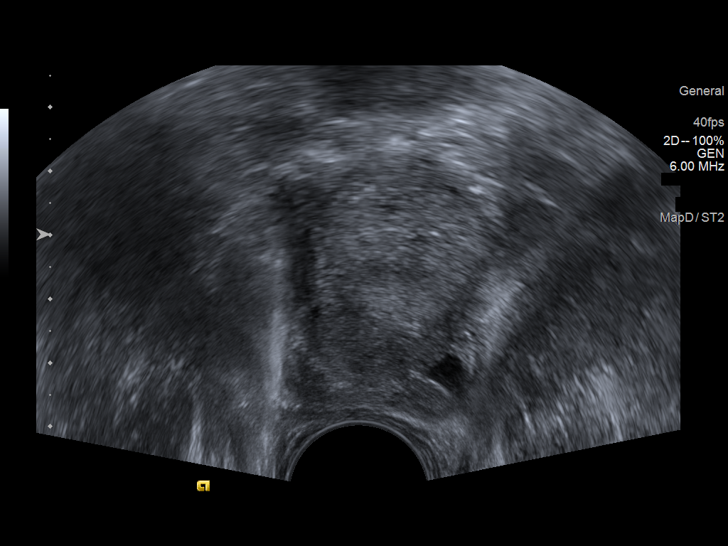
[im 14/20]
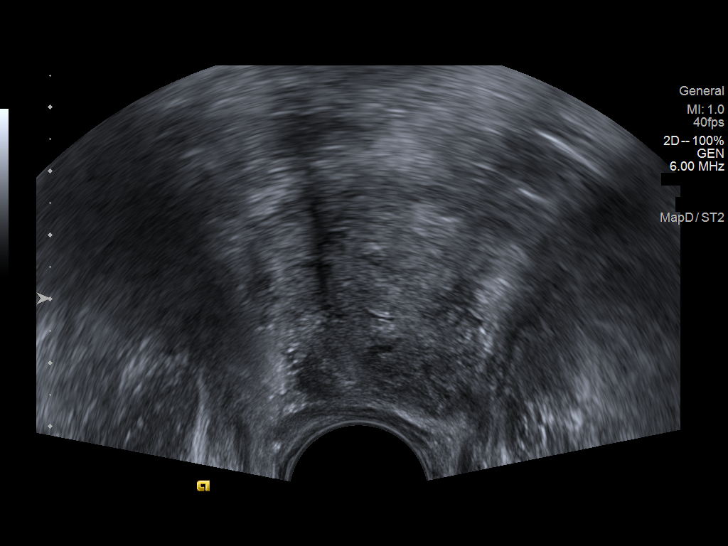
[im 16/20]
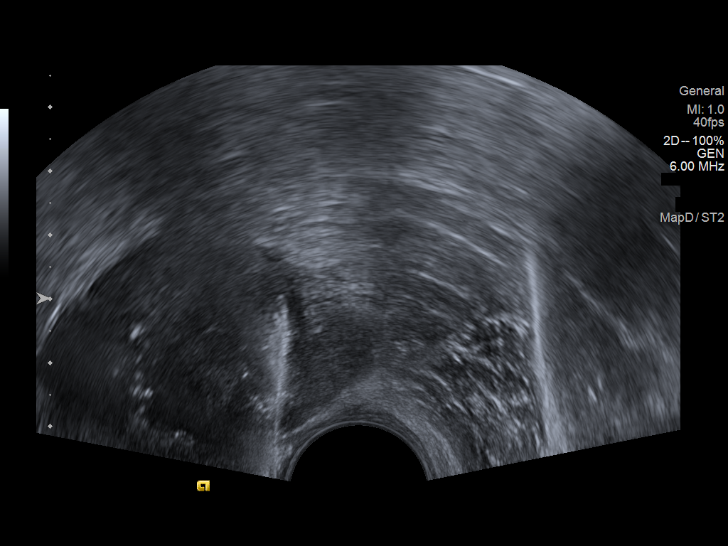
[im 17/20]
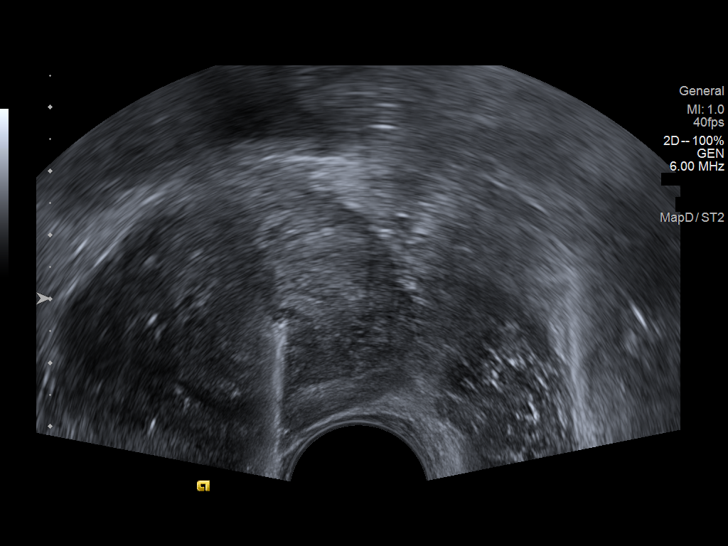
[im 18/20]
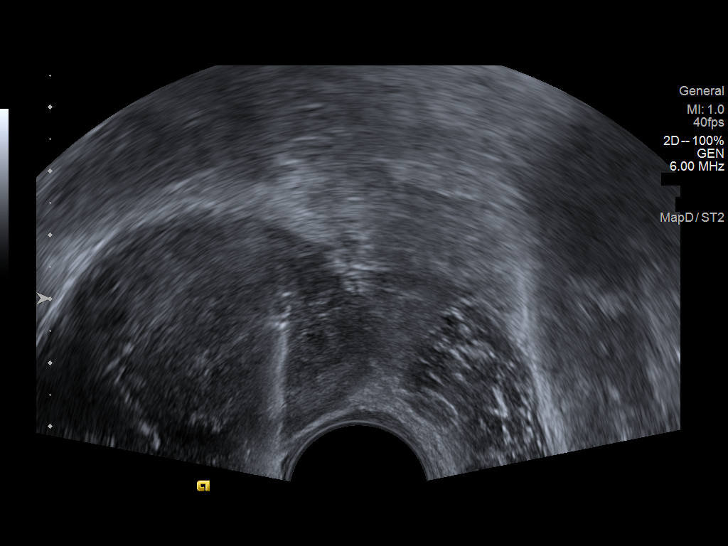
[im 20/20]
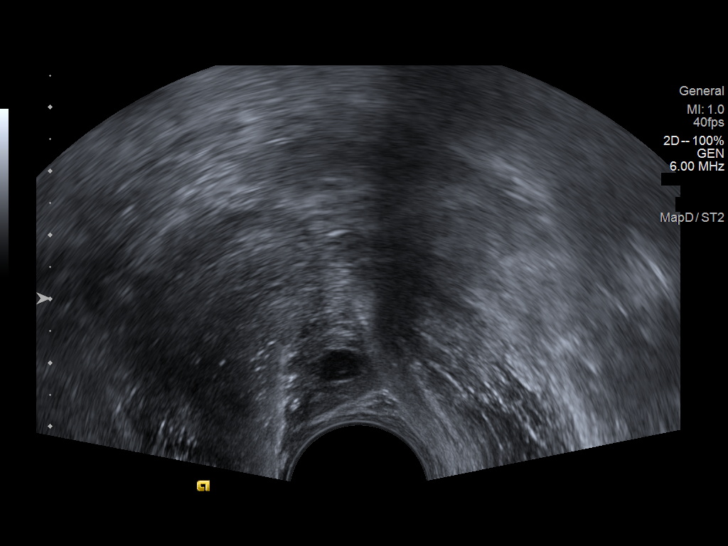

[14 of 20 positions shown; findings below may reference images not displayed]

FINDINGS: See above.
IMPRESSION: Ultrasound guidance provided for prostate biopsy.

## 2023-03-20 ENCOUNTER — Ambulatory Visit: Payer: BLUE CROSS/BLUE SHIELD | Admitting: Family Medicine

## 2023-04-28 ENCOUNTER — Encounter: Payer: Self-pay | Admitting: Family Medicine

## 2023-04-28 ENCOUNTER — Ambulatory Visit: Payer: BLUE CROSS/BLUE SHIELD | Admitting: Family Medicine

## 2023-04-28 VITALS — BP 141/87 | HR 72 | Temp 97.9°F | Ht 69.0 in | Wt 218.0 lb

## 2023-04-28 DIAGNOSIS — I10 Essential (primary) hypertension: Secondary | ICD-10-CM

## 2023-04-28 NOTE — Progress Notes (Signed)
Subjective: CC: Follow-up hypertension PCP: Raliegh Ip, DO Patrick Lambert is a 63 y.o. male presenting to clinic today for:  1.  Hypertension Patient has been working out more and lifting weights in efforts to improve physical health.  He continues to try and eat a balanced diet.  He is compliant with Norvasc 5 mg daily denies any chest pain, shortness of breath, edema or falls.   ROS: Per HPI  Allergies  Allergen Reactions   Bee Venom Anaphylaxis   Penicillins Hives, Shortness Of Breath and Itching    Did it involve swelling of the face/tongue/throat, SOB, or low BP? No Did it involve sudden or severe rash/hives, skin peeling, or any reaction on the inside of your mouth or nose? Yes Did you need to seek medical attention at a hospital or doctor's office? No When did it last happen?      Unknown If all above answers are "NO", may proceed with cephalosporin use.    Shellfish Allergy Anaphylaxis    Patient states it is all seafood.   Levaquin [Levofloxacin] Hives   Past Medical History:  Diagnosis Date   Family history of prostate cancer    Father   Hypertension    Vertigo     Current Outpatient Medications:    amLODipine (NORVASC) 5 MG tablet, TAKE 1 TABLET DAILY, Disp: 90 tablet, Rfl: 1   EPINEPHrine 0.3 mg/0.3 mL IJ SOAJ injection, Inject 0.3 mg into the muscle as needed for anaphylaxis., Disp: 3 each, Rfl: 0 Social History   Socioeconomic History   Marital status: Married    Spouse name: Patrick Lambert    Number of children: 1   Years of education: Not on file   Highest education level: Not on file  Occupational History   Occupation: retired   Tobacco Use   Smoking status: Never   Smokeless tobacco: Never  Vaping Use   Vaping status: Never Used  Substance and Sexual Activity   Alcohol use: Never   Drug use: Never   Sexual activity: Not on file  Other Topics Concern   Not on file  Social History Narrative   Health visitor. Previously  a Corporate treasurer.   Social Determinants of Health   Financial Resource Strain: Not on file  Food Insecurity: Not on file  Transportation Needs: Not on file  Physical Activity: Not on file  Stress: Not on file  Social Connections: Not on file  Intimate Partner Violence: Not on file   Family History  Problem Relation Age of Onset   Breast cancer Mother    Lung cancer Mother    Prostate cancer Father    Stomach cancer Father    Cancer Maternal Grandfather        prostate     Objective: Office vital signs reviewed. BP (!) 141/87   Pulse 72   Temp 97.9 F (36.6 C)   Ht 5\' 9"  (1.753 m)   Wt 218 lb (98.9 kg)   SpO2 99%   BMI 32.19 kg/m   Physical Examination:  General: Awake, alert, well nourished, No acute distress HEENT: sclera white, MMM Cardio: regular rate and rhythm, S1S2 heard, no murmurs appreciated Pulm: clear to auscultation bilaterally, no wheezes, rhonchi or rales; normal work of breathing on room air  Assessment/ Plan: 62 y.o. male   Primary hypertension  Blood pressure borderline.  Sounds like home blood pressures under much better control so would like him to monitor closely at home.  We discussed goal of  less than 140/90 but if persistently elevated, low threshold to advance to 10 mg of Norvasc daily.  He voiced good understanding and will send me some blood pressure readings in the next couple of weeks   [place] Shiflett M Britiny Defrain, DO [facility] [phone]

## 2023-07-03 ENCOUNTER — Other Ambulatory Visit: Payer: Self-pay | Admitting: Family Medicine

## 2023-07-03 DIAGNOSIS — I10 Essential (primary) hypertension: Secondary | ICD-10-CM

## 2023-08-18 ENCOUNTER — Other Ambulatory Visit: Payer: Self-pay | Admitting: Family Medicine

## 2023-08-18 DIAGNOSIS — I10 Essential (primary) hypertension: Secondary | ICD-10-CM

## 2023-09-21 ENCOUNTER — Encounter: Payer: Self-pay | Admitting: Family Medicine

## 2023-09-21 ENCOUNTER — Ambulatory Visit: Payer: BLUE CROSS/BLUE SHIELD | Admitting: Family Medicine

## 2023-09-21 VITALS — BP 156/90 | HR 79 | Temp 98.8°F | Ht 69.0 in | Wt 219.0 lb

## 2023-09-21 DIAGNOSIS — I10 Essential (primary) hypertension: Secondary | ICD-10-CM | POA: Diagnosis not present

## 2023-09-21 DIAGNOSIS — M76899 Other specified enthesopathies of unspecified lower limb, excluding foot: Secondary | ICD-10-CM

## 2023-09-21 DIAGNOSIS — S8991XA Unspecified injury of right lower leg, initial encounter: Secondary | ICD-10-CM

## 2023-09-21 NOTE — Progress Notes (Signed)
Subjective: CC: Right knee pain PCP: Raliegh Ip, DO Patrick Lambert is a 63 y.o. male presenting to clinic today for:  1.  Right knee pain Patient reports that he hurt his right knee while running in October.  He over flexed and internally rotated the right knee after stumbling.  He felt pain and swelling following the injury and utilize Motrin, ice and heat to alleviate symptoms.  He had a reinjury a while later when he was moving some wood.  He denies any pain going up or down hills.  No sensation of knee giving out.  No locking of the knee but occasionally does pop.  He just describes it as a soreness now above the knee.  Not having any difficulty ambulating but does have pain with pronounced flexion of the knee  2.  Hypertension Patient reports that blood pressures are running anywhere from 1 30-1 65 over 80s to 90s.  It is rare that he has anything above 140/90 but it does happen a couple of times per month.  He is compliant with Norvasc 5 mg daily.  He is working on losing weight.  He reports no chest pain, shortness of breath.  He is very physically active.  He does not add salt to food   ROS: Per HPI  Allergies  Allergen Reactions   Bee Venom Anaphylaxis   Penicillins Hives, Shortness Of Breath and Itching    Did it involve swelling of the face/tongue/throat, SOB, or low BP? No Did it involve sudden or severe rash/hives, skin peeling, or any reaction on the inside of your mouth or nose? Yes Did you need to seek medical attention at a hospital or doctor's office? No When did it last happen?      Unknown If all above answers are "NO", may proceed with cephalosporin use.    Shellfish Allergy Anaphylaxis    Patient states it is all seafood.   Levaquin [Levofloxacin] Hives   Past Medical History:  Diagnosis Date   Family history of prostate cancer    Father   Hypertension    Vertigo     Current Outpatient Medications:    amLODipine (NORVASC) 5 MG tablet, TAKE 1  TABLET DAILY, Disp: 90 tablet, Rfl: 0   EPINEPHrine 0.3 mg/0.3 mL IJ SOAJ injection, Inject 0.3 mg into the muscle as needed for anaphylaxis., Disp: 3 each, Rfl: 0 Social History   Socioeconomic History   Marital status: Married    Spouse name: Steward Drone    Number of children: 1   Years of education: Not on file   Highest education level: Not on file  Occupational History   Occupation: retired   Tobacco Use   Smoking status: Never   Smokeless tobacco: Never  Vaping Use   Vaping status: Never Used  Substance and Sexual Activity   Alcohol use: Never   Drug use: Never   Sexual activity: Not on file  Other Topics Concern   Not on file  Social History Narrative   Health visitor. Previously a Corporate treasurer.   Social Drivers of Corporate investment banker Strain: Not on file  Food Insecurity: Not on file  Transportation Needs: Not on file  Physical Activity: Not on file  Stress: Not on file  Social Connections: Not on file  Intimate Partner Violence: Not on file   Family History  Problem Relation Age of Onset   Breast cancer Mother    Lung cancer Mother    Prostate cancer  Father    Stomach cancer Father    Cancer Maternal Grandfather        prostate     Objective: Office vital signs reviewed. BP (!) 156/90   Pulse 79   Temp 98.8 F (37.1 C)   Ht 5\' 9"  (1.753 m)   Wt 219 lb (99.3 kg)   SpO2 98%   BMI 32.34 kg/m   Physical Examination:  General: Awake, alert, obese, No acute distress HEENT: Sclera white.  Moist mucous membranes Cardio: regular rate and rhythm Pulm: Normal work of breathing on room air MSK: Ambulating independently with normal gait and station.  Right knee: No gross joint swelling, erythema or warmth.  He has no tenderness palpation to the patella, patellar tendon, joint line, posterior popliteal fossa.  Negative Thessaly.  Negative ligamentous laxity.  No gross deformity but area of concern is along the quad  tendon  Assessment/ Plan: 63 y.o. male   Quadriceps tendinitis  Injury of right knee, initial encounter  Primary hypertension  I suspect quadriceps tendinitis based on exam today.  His actual knee exam was fairly unremarkable.  We discussed consideration for eval with sports medicine.  Perhaps they can look at this tendon under ultrasound.  He wishes to defer this for now.  Blood pressure is not controlled during today's visit but it sounds like it is more often controlled than not at home.  I have asked that he monitor blood pressure daily over the next week.  He will follow-up with me next week for blood pressure recheck.  May consider going up to 7.5 mg of amlodipine daily   Raliegh Ip, DO [facility] [phone]

## 2023-09-28 ENCOUNTER — Ambulatory Visit (INDEPENDENT_AMBULATORY_CARE_PROVIDER_SITE_OTHER): Payer: BLUE CROSS/BLUE SHIELD | Admitting: Family Medicine

## 2023-09-28 ENCOUNTER — Encounter: Payer: Self-pay | Admitting: Family Medicine

## 2023-09-28 VITALS — BP 148/94 | HR 77 | Temp 97.9°F | Ht 69.0 in | Wt 217.0 lb

## 2023-09-28 DIAGNOSIS — I10 Essential (primary) hypertension: Secondary | ICD-10-CM | POA: Diagnosis not present

## 2023-09-28 DIAGNOSIS — E7800 Pure hypercholesterolemia, unspecified: Secondary | ICD-10-CM | POA: Diagnosis not present

## 2023-09-28 DIAGNOSIS — Z0001 Encounter for general adult medical examination with abnormal findings: Secondary | ICD-10-CM | POA: Diagnosis not present

## 2023-09-28 DIAGNOSIS — Z87892 Personal history of anaphylaxis: Secondary | ICD-10-CM

## 2023-09-28 DIAGNOSIS — E66811 Obesity, class 1: Secondary | ICD-10-CM

## 2023-09-28 DIAGNOSIS — Z8042 Family history of malignant neoplasm of prostate: Secondary | ICD-10-CM

## 2023-09-28 DIAGNOSIS — Z0000 Encounter for general adult medical examination without abnormal findings: Secondary | ICD-10-CM

## 2023-09-28 DIAGNOSIS — R972 Elevated prostate specific antigen [PSA]: Secondary | ICD-10-CM

## 2023-09-28 LAB — BAYER DCA HB A1C WAIVED: HB A1C (BAYER DCA - WAIVED): 5.8 % — ABNORMAL HIGH (ref 4.8–5.6)

## 2023-09-28 LAB — LIPID PANEL

## 2023-09-28 MED ORDER — EPINEPHRINE 0.3 MG/0.3ML IJ SOAJ: 0.3000 mg | INTRAMUSCULAR | 0 refills | Status: DC | PRN

## 2023-09-28 MED ORDER — AMLODIPINE BESYLATE 5 MG PO TABS: 7.5000 mg | ORAL_TABLET | Freq: Every day | ORAL | 3 refills | Status: DC

## 2023-09-28 NOTE — Progress Notes (Signed)
Patrick Lambert is a 63 y.o. male presents to office today for annual physical exam examination.    Concerns today include: 1.  Hypertension He is been monitoring pressures at home and they typically are running around 140s to 150s systolic over 80s to 90s diastolic.  Surprisingly after he did some activities his blood pressure was 138/85.  He typically takes his medication around 8-8 30 and this particular blood pressure was taken around 1 PM rather than the typical 9-9:30.  No chest pain, shortness of breath, visual disturbance.  He was told by his eye doctor that he may have some developing cataracts but notes that he is not really having any issues with response time or work.  He remains very physically active and is doing an intensive exercise circuit program that requires both running and weightlifting.   Marital status: married, Substance use: none There are no preventive care reminders to display for this patient. Refills needed today: all  Immunization History  Administered Date(s) Administered   Influenza, Quadrivalent, Recombinant, Inj, Pf 06/09/2019   Moderna Sars-Covid-2 Vaccination 12/21/2019, 01/18/2020   Td 03/19/2022   Past Medical History:  Diagnosis Date   Family history of prostate cancer    Father   Hypertension    Vertigo    Social History   Socioeconomic History   Marital status: Married    Spouse name: Patrick Lambert    Number of children: 1   Years of education: Not on file   Highest education level: Not on file  Occupational History   Occupation: retired   Tobacco Use   Smoking status: Never   Smokeless tobacco: Never  Vaping Use   Vaping status: Never Used  Substance and Sexual Activity   Alcohol use: Never   Drug use: Never   Sexual activity: Not on file  Other Topics Concern   Not on file  Social History Narrative   Health visitor. Previously a Corporate treasurer.   Social Drivers of Corporate investment banker Strain: Not on  file  Food Insecurity: Not on file  Transportation Needs: Not on file  Physical Activity: Not on file  Stress: Not on file  Social Connections: Not on file  Intimate Partner Violence: Not on file   Past Surgical History:  Procedure Laterality Date   BIOPSY PROSTATE     Negative   HERNIA REPAIR  1998   Family History  Problem Relation Age of Onset   Breast cancer Mother    Lung cancer Mother    Prostate cancer Father    Stomach cancer Father    Cancer Maternal Grandfather        prostate     Current Outpatient Medications:    amLODipine (NORVASC) 5 MG tablet, TAKE 1 TABLET DAILY, Disp: 90 tablet, Rfl: 0   EPINEPHrine 0.3 mg/0.3 mL IJ SOAJ injection, Inject 0.3 mg into the muscle as needed for anaphylaxis., Disp: 3 each, Rfl: 0  Allergies  Allergen Reactions   Bee Venom Anaphylaxis   Penicillins Hives, Shortness Of Breath and Itching    Did it involve swelling of the face/tongue/throat, SOB, or low BP? No Did it involve sudden or severe rash/hives, skin peeling, or any reaction on the inside of your mouth or nose? Yes Did you need to seek medical attention at a hospital or doctor's office? No When did it last happen?      Unknown If all above answers are "NO", may proceed with cephalosporin use.    Shellfish  Allergy Anaphylaxis    Patient states it is all seafood.   Levaquin [Levofloxacin] Hives     ROS: Review of Systems A comprehensive review of systems was negative except for: Musculoskeletal: positive for occasional right upper knee pain    Physical exam BP (!) 148/94   Pulse 77   Temp 97.9 F (36.6 C)   Ht 5\' 9"  (1.753 m)   Wt 217 lb (98.4 kg)   SpO2 99%   BMI 32.05 kg/m  General appearance: alert, cooperative, appears stated age, no distress, and mildly obese Head: Normocephalic, without obvious abnormality, atraumatic Eyes: negative findings: lids and lashes normal, conjunctivae and sclerae normal, corneas clear, and pupils equal, round, reactive to  light and accomodation Ears: normal TM's and external ear canals both ears Nose: Nares normal. Septum midline. Mucosa normal. No drainage or sinus tenderness. Throat: lips, mucosa, and tongue normal; teeth and gums normal Neck: no adenopathy, supple, symmetrical, trachea midline, and thyroid not enlarged, symmetric, no tenderness/mass/nodules Back: symmetric, no curvature. ROM normal. No CVA tenderness. Lungs: clear to auscultation bilaterally Heart: regular rate and rhythm, S1, S2 normal, no murmur, click, rub or gallop Abdomen: soft, non-tender; bowel sounds normal; no masses,  no organomegaly Extremities: extremities normal, atraumatic, no cyanosis or edema Pulses: 2+ and symmetric Skin: Skin color, texture, turgor normal. No rashes or lesions Lymph nodes: Cervical, supraclavicular, and axillary nodes normal. Neurologic: Grossly normal      09/21/2023    1:39 PM 04/28/2023    2:31 PM 09/19/2022    9:30 AM  Depression screen PHQ 2/9  Decreased Interest 0 0 0  Down, Depressed, Hopeless 0 0 0  PHQ - 2 Score 0 0 0  Altered sleeping 0 0 0  Tired, decreased energy 0 0 0  Change in appetite 0 0 0  Feeling bad or failure about yourself  0 0 0  Trouble concentrating 0 0 0  Moving slowly or fidgety/restless 0 0 0  Suicidal thoughts 0 0 0  PHQ-9 Score 0 0 0  Difficult doing work/chores Not difficult at all Not difficult at all Not difficult at all      09/21/2023    1:39 PM 04/28/2023    2:26 PM 09/19/2022    9:30 AM 03/19/2022    9:29 AM  GAD 7 : Generalized Anxiety Score  Nervous, Anxious, on Edge 0 0 0 0  Control/stop worrying 0 0 0 0  Worry too much - different things 0 0 0 0  Trouble relaxing 0 0 0 0  Restless 0 0 0 0  Easily annoyed or irritable 0 0 0 0  Afraid - awful might happen 0 0 0 0  Total GAD 7 Score 0 0 0 0  Anxiety Difficulty Not difficult at all Not difficult at all  Not difficult at all     Assessment/ Plan: Patrick Lambert here for annual physical exam.    Annual physical exam  Primary hypertension - Plan: CMP14+EGFR, amLODipine (NORVASC) 5 MG tablet  Pure hypercholesterolemia - Plan: CMP14+EGFR, Lipid Panel, TSH  Obesity (BMI 30.0-34.9) - Plan: VITAMIN D 25 Hydroxy (Vit-D Deficiency, Fractures), Bayer DCA Hb A1c Waived  Elevated PSA - Plan: CBC  Family history of prostate cancer - Plan: CBC  History of anaphylaxis - Plan: EPINEPHrine 0.3 mg/0.3 mL IJ SOAJ injection, CBC  Fasting labs collected.  Blood pressure remains uncontrolled both at home and here so I have gone ahead and advance his amlodipine to 7.5 mg daily.  Encouraged to  take blood pressures at afternoon to ensure that this medication has been back and absorbed totally.  He is not currently treated with cholesterol medication but we did discuss given history of hypertension, hyperlipidemia and obesity that may be worth getting a coronary artery calcium scoring.  He will consider this and I will revisit this again at next visit in 3 months  Is seeing urology for elevation in PSA.  Check CBC.  Future order for PSA standing.  Unsure as to when he is supposed to have that collected  EpiPen renewed  Counseled on healthy lifestyle choices, including diet (rich in fruits, vegetables and lean meats and low in salt and simple carbohydrates) and exercise (at least 30 minutes of moderate physical activity daily).  Discussed A1c was consistent with prediabetes and I highly recommended that he eliminate soda intake from diet  Patient to follow up 46m for BP check  Jola Critzer M. Nadine Counts, DO

## 2023-09-29 ENCOUNTER — Other Ambulatory Visit: Payer: Self-pay | Admitting: Family Medicine

## 2023-09-29 ENCOUNTER — Encounter: Payer: Self-pay | Admitting: Family Medicine

## 2023-09-29 DIAGNOSIS — E559 Vitamin D deficiency, unspecified: Secondary | ICD-10-CM

## 2023-09-29 LAB — CMP14+EGFR
ALT: 20 IU/L (ref 0–44)
AST: 21 IU/L (ref 0–40)
Albumin: 4.3 g/dL (ref 3.9–4.9)
Alkaline Phosphatase: 72 IU/L (ref 44–121)
BUN/Creatinine Ratio: 12 (ref 10–24)
BUN: 15 mg/dL (ref 8–27)
Bilirubin Total: 0.4 mg/dL (ref 0.0–1.2)
CO2: 26 mmol/L (ref 20–29)
Calcium: 9.2 mg/dL (ref 8.6–10.2)
Chloride: 105 mmol/L (ref 96–106)
Creatinine, Ser: 1.25 mg/dL (ref 0.76–1.27)
Globulin, Total: 2.5 g/dL (ref 1.5–4.5)
Glucose: 99 mg/dL (ref 70–99)
Potassium: 4 mmol/L (ref 3.5–5.2)
Sodium: 145 mmol/L — ABNORMAL HIGH (ref 134–144)
Total Protein: 6.8 g/dL (ref 6.0–8.5)
eGFR: 65 mL/min/{1.73_m2} (ref >59)

## 2023-09-29 LAB — LIPID PANEL
Cholesterol, Total: 175 mg/dL (ref 100–199)
HDL: 45 mg/dL (ref >39)
LDL CALC COMMENT:: 3.9 ratio (ref 0.0–5.0)
LDL Chol Calc (NIH): 114 mg/dL — ABNORMAL HIGH (ref 0–99)
Triglycerides: 84 mg/dL (ref 0–149)
VLDL Cholesterol Cal: 16 mg/dL (ref 5–40)

## 2023-09-29 LAB — CBC
Hematocrit: 48.3 % (ref 37.5–51.0)
Hemoglobin: 16.1 g/dL (ref 13.0–17.7)
MCH: 30.5 pg (ref 26.6–33.0)
MCHC: 33.3 g/dL (ref 31.5–35.7)
MCV: 92 fL (ref 79–97)
Platelets: 211 10*3/uL (ref 150–450)
RBC: 5.28 x10E6/uL (ref 4.14–5.80)
RDW: 12.7 % (ref 11.6–15.4)
WBC: 5.5 10*3/uL (ref 3.4–10.8)

## 2023-09-29 LAB — TSH: TSH: 1.29 u[IU]/mL (ref 0.450–4.500)

## 2023-09-29 LAB — VITAMIN D 25 HYDROXY (VIT D DEFICIENCY, FRACTURES): Vit D, 25-Hydroxy: 13.5 ng/mL — ABNORMAL LOW (ref 30.0–100.0)

## 2023-09-29 MED ORDER — VITAMIN D (ERGOCALCIFEROL) 1.25 MG (50000 UNIT) PO CAPS: 50000.0000 [IU] | ORAL_CAPSULE | ORAL | 0 refills | Status: DC

## 2023-10-05 ENCOUNTER — Ambulatory Visit: Payer: BLUE CROSS/BLUE SHIELD | Admitting: Urology

## 2023-10-23 ENCOUNTER — Ambulatory Visit: Payer: BLUE CROSS/BLUE SHIELD | Admitting: Urology

## 2023-11-03 ENCOUNTER — Other Ambulatory Visit: Payer: Self-pay | Admitting: Family Medicine

## 2023-11-03 DIAGNOSIS — I10 Essential (primary) hypertension: Secondary | ICD-10-CM

## 2023-12-02 ENCOUNTER — Other Ambulatory Visit

## 2023-12-02 ENCOUNTER — Other Ambulatory Visit: Payer: Self-pay | Admitting: Urology

## 2023-12-03 LAB — PSA, TOTAL AND FREE
PSA, Free Pct: 26.4 %
PSA, Free: 1.74 ng/mL
Prostate Specific Ag, Serum: 6.6 ng/mL — ABNORMAL HIGH (ref 0.0–4.0)

## 2023-12-04 ENCOUNTER — Ambulatory Visit: Payer: BLUE CROSS/BLUE SHIELD | Admitting: Urology

## 2023-12-04 VITALS — BP 142/88 | HR 74

## 2023-12-04 DIAGNOSIS — R972 Elevated prostate specific antigen [PSA]: Secondary | ICD-10-CM

## 2023-12-04 DIAGNOSIS — N400 Enlarged prostate without lower urinary tract symptoms: Secondary | ICD-10-CM

## 2023-12-04 LAB — URINALYSIS, ROUTINE W REFLEX MICROSCOPIC
Bilirubin, UA: NEGATIVE
Glucose, UA: NEGATIVE
Ketones, UA: NEGATIVE
Leukocytes,UA: NEGATIVE
Nitrite, UA: NEGATIVE
Protein,UA: NEGATIVE
Specific Gravity, UA: 1.03 (ref 1.005–1.030)
Urobilinogen, Ur: 0.2 mg/dL (ref 0.2–1.0)
pH, UA: 5.5 (ref 5.0–7.5)

## 2023-12-04 LAB — MICROSCOPIC EXAMINATION
Bacteria, UA: NONE SEEN
WBC, UA: NONE SEEN /HPF (ref 0–5)

## 2023-12-04 NOTE — Progress Notes (Signed)
 12/04/2023 11:04 AM   Patrick Lambert [birth year] [id number]  Referring provider: Eliodoro Guerin, DO [address] [place],  [place] [address]  Elevated PSA   HPI: Patrick Lambert is a 62yo here for followup for elevated PSA. PSA increased to 6.6. IPSS 3 QOl 0. No worsening LUTS. No dysuria. No toher complaints today   PMH: Past Medical History:  Diagnosis Date   Family history of prostate cancer    Father   Hypertension    Vertigo     Surgical History: Past Surgical History:  Procedure Laterality Date   BIOPSY PROSTATE     Negative   HERNIA REPAIR  1998    Home Medications:  Allergies as of 12/04/2023       Reactions   Bee Venom Anaphylaxis   Penicillins Hives, Shortness Of Breath, Itching   Did it involve swelling of the face/tongue/throat, SOB, or low BP? No Did it involve sudden or severe rash/hives, skin peeling, or any reaction on the inside of your mouth or nose? Yes Did you need to seek medical attention at a hospital or doctor's office? No When did it last happen?      Unknown If all above answers are "NO", may proceed with cephalosporin use.   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   Levaquin [levofloxacin] Hives        Medication List        Accurate as of December 04, 2023 11:04 AM. If you have any questions, ask your nurse or doctor.          amLODipine  5 MG tablet Commonly known as: NORVASC  TAKE 1 TABLET DAILY   EPINEPHrine  0.3 mg/0.3 mL Soaj injection Commonly known as: EPI-PEN Inject 0.3 mg into the muscle as needed for anaphylaxis.   Vitamin D  (Ergocalciferol ) 1.25 MG (50000 UNIT) Caps capsule Commonly known as: DRISDOL  Take 1 capsule (50,000 Units total) by mouth every 7 (seven) days. X12 weeks. Then start OTC Vit D 800 international units daily.        Allergies:  Allergies  Allergen Reactions   Bee Venom Anaphylaxis   Penicillins Hives, Shortness Of Breath and Itching    Did it involve swelling of  the face/tongue/throat, SOB, or low BP? No Did it involve sudden or severe rash/hives, skin peeling, or any reaction on the inside of your mouth or nose? Yes Did you need to seek medical attention at a hospital or doctor's office? No When did it last happen?      Unknown If all above answers are "NO", may proceed with cephalosporin use.    Shellfish Allergy Anaphylaxis    Patient states it is all seafood.   Levaquin [Levofloxacin] Hives    Family History: Family History  Problem Relation Age of Onset   Breast cancer Mother    Lung cancer Mother    Prostate cancer Father    Stomach cancer Father    Cancer Maternal Grandfather        prostate     Social History:  reports that he has never smoked. He has never used smokeless tobacco. He reports that he does not drink alcohol and does not use drugs.  ROS: All other review of systems were reviewed and are negative except what is noted above in HPI  Physical Exam: BP (!) 142/88   Pulse 74   Constitutional:  Alert and oriented, No acute distress. HEENT: [facility] AT, moist mucus membranes.  Trachea midline, no masses.  Cardiovascular: No clubbing, cyanosis, or edema. Respiratory: Normal respiratory effort, no increased work of breathing. GI: Abdomen is soft, nontender, nondistended, no abdominal masses GU: No CVA tenderness.  Lymph: No cervical or inguinal lymphadenopathy. Skin: No rashes, bruises or suspicious lesions. Neurologic: Grossly intact, no focal deficits, moving all 4 extremities. Psychiatric: Normal mood and affect.  Laboratory Data: Lab Results  Component Value Date   WBC 5.5 09/28/2023   HGB 16.1 09/28/2023   HCT 48.3 09/28/2023   MCV 92 09/28/2023   PLT 211 09/28/2023    Lab Results  Component Value Date   CREATININE 1.25 09/28/2023    No results found for: "PSA"  No results found for: "TESTOSTERONE"  Lab Results  Component Value Date   HGBA1C 5.8 (H) 09/28/2023    Urinalysis    Component Value  Date/Time   APPEARANCEUR Clear 04/04/2022 1111   GLUCOSEU Negative 04/04/2022 1111   BILIRUBINUR Negative 04/04/2022 1111   PROTEINUR 1+ (A) 04/04/2022 1111   UROBILINOGEN 0.2 10/07/2019 1334   NITRITE Negative 04/04/2022 1111   LEUKOCYTESUR Negative 04/04/2022 1111    Lab Results  Component Value Date   LABMICR See below: 04/04/2022   WBCUA None seen 04/04/2022   LABEPIT None seen 04/04/2022   MUCUS Present 04/04/2022   BACTERIA None seen 04/04/2022    Pertinent Imaging:  No results found for this or any previous visit.  No results found for this or any previous visit.  No results found for this or any previous visit.  No results found for this or any previous visit.  No results found for this or any previous visit.  No results found for this or any previous visit.  No results found for this or any previous visit.  No results found for this or any previous visit.   Assessment & Plan:    1. Elevated PSA (Primary) IsoPSA, will call with results. If positive we will proceed with MRI and possible fusion biopsy. If Iso PSA normal. Followup 1 year with PSA - Urinalysis, Routine w reflex microscopic   No follow-ups on file.  Johnie Nailer, MD  [facility]

## 2023-12-17 ENCOUNTER — Other Ambulatory Visit: Payer: Self-pay | Admitting: Urology

## 2023-12-17 ENCOUNTER — Telehealth: Payer: Self-pay | Admitting: Urology

## 2023-12-17 DIAGNOSIS — N400 Enlarged prostate without lower urinary tract symptoms: Secondary | ICD-10-CM | POA: Insufficient documentation

## 2023-12-17 DIAGNOSIS — Z8042 Family history of malignant neoplasm of prostate: Secondary | ICD-10-CM

## 2023-12-17 DIAGNOSIS — R972 Elevated prostate specific antigen [PSA]: Secondary | ICD-10-CM

## 2023-12-17 NOTE — Telephone Encounter (Signed)
 Please review ISO PSA results.  Patient wants to know if he needs to move forward with MRI if you can send a valium in prior

## 2023-12-17 NOTE — Telephone Encounter (Signed)
 Patient called wanting ISO PSA results. He was called about an MRI but did not receive results

## 2023-12-18 ENCOUNTER — Other Ambulatory Visit: Payer: Self-pay | Admitting: Family Medicine

## 2023-12-18 DIAGNOSIS — E559 Vitamin D deficiency, unspecified: Secondary | ICD-10-CM

## 2023-12-20 ENCOUNTER — Encounter: Payer: Self-pay | Admitting: Urology

## 2023-12-20 NOTE — Patient Instructions (Signed)

## 2023-12-22 ENCOUNTER — Telehealth: Payer: Self-pay | Admitting: Urology

## 2023-12-22 NOTE — Telephone Encounter (Signed)
 Patient called very upset he has not received a call about his lab work yet, but he got a call from the hospital to schedule the imaging. He does not want to have the MRI done until he has all his results back and has talked to Dr Claretta Croft or Dr Mozell Arias nurse. Patient would like a call back by end of day just to speak with someone clinical.    Im not sure why the hospital called him to schedule we do not have the MRI authorized yet. We are not in network with his insurance and they will not approve it ( I called and filed an appeal and waiting on them to respond back.)

## 2023-12-22 NOTE — Telephone Encounter (Signed)
 Patient would like to have an open MRI if possible, he is claustrophobic.  Patient is aware if insurance does not approve open MRI that we have requested a valium  be sent in prior to his procedure.  If he needs the MRI after MD review his results he would like a call as soon as possible once insurance approves this.  Will you please call and cancel the 04/25 if his insurance will not approve at [facility].  If insurance will cover imaging at this location he will keep scheduled appt pending MD response to labs.

## 2023-12-22 NOTE — Telephone Encounter (Signed)
 Pt is aware to take 800 u OTC, this is what he is doing.

## 2023-12-22 NOTE — Telephone Encounter (Signed)
 Please note SIG. He is supposed to start OTC after rx.

## 2023-12-22 NOTE — Telephone Encounter (Signed)
 Last time I spoke to patient I informed him that Dr. Claretta Croft was out of the office and that we would have MD review results no later than 12/23/2023 that way patient would know if he needed to cancel imaging prior to scheduled date of 04/25.  Note from 04/17 was forwarded to MD requesting him to review ISOpsa results.

## 2023-12-23 ENCOUNTER — Other Ambulatory Visit: Payer: Self-pay | Admitting: Urology

## 2023-12-23 MED ORDER — DIAZEPAM 10 MG PO TABS: 10.0000 mg | ORAL_TABLET | Freq: Once | ORAL | 0 refills | Status: AC

## 2023-12-23 NOTE — Telephone Encounter (Signed)
 Dr. Claretta Croft reviewed patient's ISO psa results and gave verbal to inform patient is results came back at 8.3 and he needs to move forward with the scheduled MRI.  I called patient to inform him of results and MD's response to imaging.  I also informed him that per Benigno Brakeman, his insurance approved his scheduled MRI at [facility].  Patient provided with pre service number to call ahead to be sure there is no out of pocket cost for his imaging.  If so and he is unable to keep imaging to call our office back.  Patient states he has previously had an MRI at [facility] and had no out of pocket cost.

## 2023-12-25 ENCOUNTER — Other Ambulatory Visit: Payer: Self-pay | Admitting: Urology

## 2023-12-25 ENCOUNTER — Ambulatory Visit (HOSPITAL_COMMUNITY)
Admission: RE | Admit: 2023-12-25 | Discharge: 2023-12-25 | Disposition: A | Discharge status: Home / Self Care | Source: Ambulatory Visit | Attending: Urology | Admitting: Urology

## 2023-12-25 DIAGNOSIS — R972 Elevated prostate specific antigen [PSA]: Secondary | ICD-10-CM | POA: Insufficient documentation

## 2023-12-25 DIAGNOSIS — Z8042 Family history of malignant neoplasm of prostate: Secondary | ICD-10-CM

## 2023-12-25 DIAGNOSIS — K5730 Diverticulosis of large intestine without perforation or abscess without bleeding: Secondary | ICD-10-CM | POA: Diagnosis not present

## 2023-12-25 DIAGNOSIS — N400 Enlarged prostate without lower urinary tract symptoms: Secondary | ICD-10-CM | POA: Diagnosis not present

## 2024-01-05 NOTE — Progress Notes (Signed)
 Letter sent.

## 2024-01-06 ENCOUNTER — Encounter (HOSPITAL_COMMUNITY): Payer: Self-pay

## 2024-01-06 ENCOUNTER — Telehealth: Payer: Self-pay | Admitting: Urology

## 2024-01-06 NOTE — Telephone Encounter (Signed)
 Return called to patient.Patient was made aware MRI was negative. Voiced understanding.

## 2024-01-06 NOTE — Telephone Encounter (Signed)
 Wants results of MRI, said we never called with lab results and would like a call for MRI results

## 2024-01-12 ENCOUNTER — Telehealth: Payer: Self-pay

## 2024-01-12 NOTE — Telephone Encounter (Signed)
 Attempted to call pt 05/12   2nd time 05/13 left vm regarding apt with pcp on 05/14 , pt will need to get apt r/s

## 2024-01-13 ENCOUNTER — Ambulatory Visit: Payer: BLUE CROSS/BLUE SHIELD | Admitting: Family Medicine

## 2024-02-17 ENCOUNTER — Ambulatory Visit: Admitting: Family Medicine

## 2024-02-17 ENCOUNTER — Other Ambulatory Visit: Payer: Self-pay | Admitting: Family Medicine

## 2024-02-17 ENCOUNTER — Encounter: Payer: Self-pay | Admitting: Family Medicine

## 2024-02-17 VITALS — BP 128/83 | HR 78 | Temp 98.2°F | Ht 69.0 in | Wt 204.6 lb

## 2024-02-17 DIAGNOSIS — Z87892 Personal history of anaphylaxis: Secondary | ICD-10-CM

## 2024-02-17 DIAGNOSIS — I10 Essential (primary) hypertension: Secondary | ICD-10-CM | POA: Diagnosis not present

## 2024-02-17 MED ORDER — EPINEPHRINE 0.3 MG/0.3ML IJ SOAJ: 0.3000 mg | INTRAMUSCULAR | 0 refills | Status: DC | PRN

## 2024-02-17 MED ORDER — AMLODIPINE BESYLATE 5 MG PO TABS: 7.5000 mg | ORAL_TABLET | Freq: Every day | ORAL | 3 refills | Status: DC

## 2024-02-17 NOTE — Progress Notes (Signed)
 Subjective: CC:HTN PCP: Eliodoro Guerin, DO ZOX:WRUEAV Patrick Lambert is a 63 y.o. male presenting to clinic today for:  1. HTN Reports blood pressures are typically running 130s sometimes 140s over 80s to 90s.  He remains very physically active and has been lifestyle modifying in order to lose weight and get healthier.  He reports no chest pain, shortness of breath or lower extremity edema but does have about 10 minutes of dizziness following the Norvasc  7.5 mg.  He typically just wakes up in the morning and then lies right back down to avoid this.   ROS: Per HPI  Allergies  Allergen Reactions   Bee Venom Anaphylaxis   Penicillins Hives, Shortness Of Breath and Itching    Did it involve swelling of the face/tongue/throat, SOB, or low BP? No Did it involve sudden or severe rash/hives, skin peeling, or any reaction on the inside of your mouth or nose? Yes Did you need to seek medical attention at a hospital or doctor's office? No When did it last happen?      Unknown If all above answers are NO, may proceed with cephalosporin use.    Shellfish Allergy Anaphylaxis    Patient states it is all seafood.   Levaquin [Levofloxacin] Hives   Past Medical History:  Diagnosis Date   Family history of prostate cancer    Father   Hypertension    Vertigo     Current Outpatient Medications:    amLODipine  (NORVASC ) 5 MG tablet, TAKE 1 TABLET DAILY, Disp: 90 tablet, Rfl: 0   EPINEPHrine  0.3 mg/0.3 mL IJ SOAJ injection, Inject 0.3 mg into the muscle as needed for anaphylaxis., Disp: 3 each, Rfl: 0   Vitamin D , Ergocalciferol , (DRISDOL ) 1.25 MG (50000 UNIT) CAPS capsule, Take 1 capsule (50,000 Units total) by mouth every 7 (seven) days. X12 weeks. Then start OTC Vit D 800 international units daily., Disp: 12 capsule, Rfl: 0 Social History   Socioeconomic History   Marital status: Married    Spouse name: Patrick Lambert    Number of children: 1   Years of education: Not on file   Highest education  level: Not on file  Occupational History   Occupation: retired   Tobacco Use   Smoking status: Never   Smokeless tobacco: Never  Vaping Use   Vaping status: Never Used  Substance and Sexual Activity   Alcohol use: Never   Drug use: Never   Sexual activity: Not on file  Other Topics Concern   Not on file  Social History Narrative   Health visitor. Previously a Corporate treasurer.   Has a new grandbaby, [employer].   Social Drivers of Corporate investment banker Strain: Low Risk  (09/28/2023)   Overall Financial Resource Strain (CARDIA)    Difficulty of Paying Living Expenses: Not hard at all  Food Insecurity: No Food Insecurity (09/28/2023)   Hunger Vital Sign    Worried About Running Out of Food in the Last Year: Never true    Ran Out of Food in the Last Year: Never true  Transportation Needs: No Transportation Needs (09/28/2023)   PRAPARE - Administrator, Civil Service (Medical): No    Lack of Transportation (Non-Medical): No  Physical Activity: Sufficiently Active (09/28/2023)   Exercise Vital Sign    Days of Exercise per Week: 7 days    Minutes of Exercise per Session: 60 min  Stress: No Stress Concern Present (09/28/2023)   [employer] of Occupational Health -  Occupational Stress Questionnaire    Feeling of Stress : Not at all  Social Connections: Socially Integrated (09/28/2023)   Social Connection and Isolation Panel    Frequency of Communication with Friends and Family: More than three times a week    Frequency of Social Gatherings with Friends and Family: Three times a week    Attends Religious Services: More than 4 times per year    Active Member of Clubs or Organizations: Yes    Attends Banker Meetings: 1 to 4 times per year    Marital Status: Married  Catering manager Violence: Not At Risk (09/28/2023)   Humiliation, Afraid, Rape, and Kick questionnaire    Fear of Current or Ex-Partner: No    Emotionally  Abused: No    Physically Abused: No    Sexually Abused: No   Family History  Problem Relation Age of Onset   Breast cancer Mother    Lung cancer Mother    Prostate cancer Father    Stomach cancer Father    Cancer Maternal Grandfather        prostate     Objective: Office vital signs reviewed. BP 128/83   Pulse 78   Temp 98.2 F (36.8 C)   Ht 5' 9 (1.753 m)   Wt 204 lb 9.6 oz (92.8 kg)   SpO2 95%   BMI 30.21 kg/m   Physical Examination:  General: Awake, alert, well nourished, No acute distress HEENT: sclera white, MMM Cardio: regular rate and rhythm, S1S2 heard, no murmurs appreciated Pulm: clear to auscultation bilaterally, no wheezes, rhonchi or rales; normal work of breathing on room air  Assessment/ Plan: 62 y.o. male   Primary hypertension - Plan: amLODipine  (NORVASC ) 5 MG tablet  History of anaphylaxis - Plan: EPINEPHrine  0.3 mg/0.3 mL IJ SOAJ injection  Blood pressure is controlled upon recheck.  Continue 7.5 mg of amlodipine  daily as prescribed.  Epinephrine  was also renewed  Follow-up in 6 months for annual physical, sooner if concerns arise   Eliodoro Guerin, DO [facility] [phone]

## 2024-03-06 ENCOUNTER — Other Ambulatory Visit: Payer: Self-pay | Admitting: Family Medicine

## 2024-03-06 DIAGNOSIS — I10 Essential (primary) hypertension: Secondary | ICD-10-CM

## 2024-07-20 ENCOUNTER — Other Ambulatory Visit: Payer: Self-pay | Admitting: Family Medicine

## 2024-07-20 DIAGNOSIS — I10 Essential (primary) hypertension: Secondary | ICD-10-CM

## 2024-07-22 ENCOUNTER — Other Ambulatory Visit: Payer: Self-pay | Admitting: Family Medicine

## 2024-07-22 DIAGNOSIS — Z87892 Personal history of anaphylaxis: Secondary | ICD-10-CM

## 2024-09-06 ENCOUNTER — Other Ambulatory Visit: Payer: Self-pay | Admitting: Family Medicine

## 2024-09-06 DIAGNOSIS — Z87892 Personal history of anaphylaxis: Secondary | ICD-10-CM

## 2024-10-24 ENCOUNTER — Encounter: Payer: Self-pay | Admitting: Family Medicine

## 2024-11-30 ENCOUNTER — Other Ambulatory Visit

## 2024-12-02 ENCOUNTER — Ambulatory Visit: Admitting: Urology

## 2025-02-07 ENCOUNTER — Encounter: Admitting: Family Medicine
